# Patient Record
Sex: Female | Born: 1975 | Race: White | Hispanic: No | Marital: Married | Smoking: Current every day smoker
Health system: Southern US, Community
[De-identification: ages and names within clinical notes are randomized; demographics above are authoritative.]

## PROBLEM LIST (undated history)

## (undated) DIAGNOSIS — R591 Generalized enlarged lymph nodes: Secondary | ICD-10-CM

## (undated) DIAGNOSIS — Z1379 Encounter for other screening for genetic and chromosomal anomalies: Principal | ICD-10-CM

## (undated) DIAGNOSIS — K769 Liver disease, unspecified: Secondary | ICD-10-CM

## (undated) DIAGNOSIS — Z803 Family history of malignant neoplasm of breast: Secondary | ICD-10-CM

## (undated) DIAGNOSIS — E0510 Thyrotoxicosis with toxic single thyroid nodule without thyrotoxic crisis or storm: Secondary | ICD-10-CM

## (undated) HISTORY — DX: Liver disease, unspecified: K76.9

## (undated) HISTORY — DX: Hypocalcemia: E83.51

## (undated) HISTORY — DX: Family history of malignant neoplasm of breast: Z80.3

## (undated) HISTORY — DX: Encounter for other screening for genetic and chromosomal anomalies: Z13.79

## (undated) HISTORY — PX: THYROIDECTOMY: SHX17

## (undated) HISTORY — DX: Generalized enlarged lymph nodes: R59.1

---

## 1995-03-03 HISTORY — PX: APPENDECTOMY: SHX54

## 1999-03-03 HISTORY — PX: TUBAL LIGATION: SHX77

## 2003-03-03 HISTORY — PX: ABDOMINAL HYSTERECTOMY: SHX81

## 2010-04-03 ENCOUNTER — Emergency Department (HOSPITAL_BASED_OUTPATIENT_CLINIC_OR_DEPARTMENT_OTHER)
Admission: EM | Admit: 2010-04-03 | Discharge: 2010-04-03 | Disposition: A | Discharge status: Home / Self Care | Payer: BC Managed Care – PPO | Attending: Emergency Medicine | Admitting: Emergency Medicine

## 2010-04-03 DIAGNOSIS — R059 Cough, unspecified: Secondary | ICD-10-CM | POA: Insufficient documentation

## 2010-04-03 DIAGNOSIS — M940 Chondrocostal junction syndrome [Tietze]: Secondary | ICD-10-CM | POA: Insufficient documentation

## 2010-04-03 DIAGNOSIS — F17200 Nicotine dependence, unspecified, uncomplicated: Secondary | ICD-10-CM | POA: Insufficient documentation

## 2010-04-03 DIAGNOSIS — R0789 Other chest pain: Secondary | ICD-10-CM | POA: Insufficient documentation

## 2010-04-03 DIAGNOSIS — J40 Bronchitis, not specified as acute or chronic: Secondary | ICD-10-CM | POA: Insufficient documentation

## 2010-04-03 DIAGNOSIS — R05 Cough: Secondary | ICD-10-CM | POA: Insufficient documentation

## 2010-04-03 LAB — CBC
HCT: 39.6 % (ref 36.0–46.0)
Hemoglobin: 13.8 g/dL (ref 12.0–15.0)
MCH: 30.3 pg (ref 26.0–34.0)
MCHC: 34.8 g/dL (ref 30.0–36.0)
RBC: 4.55 MIL/uL (ref 3.87–5.11)

## 2010-04-03 LAB — BASIC METABOLIC PANEL
BUN: 10 mg/dL (ref 6–23)
Chloride: 107 mEq/L (ref 96–112)
Creatinine, Ser: 0.6 mg/dL (ref 0.4–1.2)
GFR calc Af Amer: >60 mL/min (ref >60)
GFR calc non Af Amer: >60 mL/min (ref >60)
Potassium: 3.5 mEq/L (ref 3.5–5.1)

## 2010-04-03 LAB — DIFFERENTIAL
Lymphocytes Relative: 31 % (ref 12–46)
Monocytes Absolute: 0.8 10*3/uL (ref 0.1–1.0)
Monocytes Relative: 10 % (ref 3–12)
Neutro Abs: 4.6 10*3/uL (ref 1.7–7.7)
Neutrophils Relative %: 58 % (ref 43–77)

## 2010-04-03 LAB — D-DIMER, QUANTITATIVE: D-Dimer, Quant: 0.22 ug/mL-FEU (ref 0.00–0.48)

## 2010-04-03 LAB — POCT CARDIAC MARKERS
CKMB, poc: 1.3 ng/mL (ref 1.0–8.0)
Troponin i, poc: <0.05 ng/mL (ref 0.00–0.09)

## 2010-04-07 ENCOUNTER — Encounter (HOSPITAL_BASED_OUTPATIENT_CLINIC_OR_DEPARTMENT_OTHER): Payer: Self-pay | Admitting: Radiology

## 2010-04-07 ENCOUNTER — Emergency Department (INDEPENDENT_AMBULATORY_CARE_PROVIDER_SITE_OTHER): Payer: BC Managed Care – PPO

## 2010-04-07 ENCOUNTER — Emergency Department (HOSPITAL_BASED_OUTPATIENT_CLINIC_OR_DEPARTMENT_OTHER)
Admission: EM | Admit: 2010-04-07 | Discharge: 2010-04-07 | Disposition: A | Discharge status: Home / Self Care | Payer: BC Managed Care – PPO | Attending: Emergency Medicine | Admitting: Emergency Medicine

## 2010-04-07 DIAGNOSIS — F17200 Nicotine dependence, unspecified, uncomplicated: Secondary | ICD-10-CM | POA: Insufficient documentation

## 2010-04-07 DIAGNOSIS — R079 Chest pain, unspecified: Secondary | ICD-10-CM

## 2010-04-07 DIAGNOSIS — R002 Palpitations: Secondary | ICD-10-CM

## 2010-04-07 DIAGNOSIS — R071 Chest pain on breathing: Secondary | ICD-10-CM | POA: Insufficient documentation

## 2010-04-07 LAB — BASIC METABOLIC PANEL
CO2: 24 mEq/L (ref 19–32)
Chloride: 108 mEq/L (ref 96–112)
GFR calc Af Amer: >60 mL/min (ref >60)
Potassium: 4.1 mEq/L (ref 3.5–5.1)
Sodium: 143 mEq/L (ref 135–145)

## 2010-04-07 LAB — POCT CARDIAC MARKERS
CKMB, poc: <1 ng/mL — ABNORMAL LOW (ref 1.0–8.0)
Myoglobin, poc: 27.2 ng/mL (ref 12–200)
Troponin i, poc: <0.05 ng/mL (ref 0.00–0.09)

## 2010-04-07 LAB — CBC
Hemoglobin: 14 g/dL (ref 12.0–15.0)
Platelets: 170 10*3/uL (ref 150–400)
RBC: 4.6 MIL/uL (ref 3.87–5.11)
WBC: 5.8 10*3/uL (ref 4.0–10.5)

## 2010-04-07 LAB — DIFFERENTIAL
Basophils Relative: 0 % (ref 0–1)
Eosinophils Absolute: 0 10*3/uL (ref 0.0–0.7)
Monocytes Relative: 8 % (ref 3–12)
Neutro Abs: 3 10*3/uL (ref 1.7–7.7)
Neutrophils Relative %: 52 % (ref 43–77)

## 2010-04-07 MED ORDER — IOHEXOL 350 MG/ML SOLN
100.0000 mL | Freq: Once | INTRAVENOUS | Status: AC | PRN
  Administered 2010-04-07: 80 mL via INTRAVENOUS

## 2010-04-15 ENCOUNTER — Ambulatory Visit (INDEPENDENT_AMBULATORY_CARE_PROVIDER_SITE_OTHER): Payer: BC Managed Care – PPO | Admitting: Internal Medicine

## 2010-04-15 ENCOUNTER — Other Ambulatory Visit: Payer: Self-pay | Admitting: Internal Medicine

## 2010-04-15 DIAGNOSIS — R922 Inconclusive mammogram: Secondary | ICD-10-CM

## 2010-04-15 DIAGNOSIS — N63 Unspecified lump in unspecified breast: Secondary | ICD-10-CM

## 2010-04-15 DIAGNOSIS — Z803 Family history of malignant neoplasm of breast: Secondary | ICD-10-CM

## 2010-04-15 DIAGNOSIS — E041 Nontoxic single thyroid nodule: Secondary | ICD-10-CM

## 2010-04-21 ENCOUNTER — Ambulatory Visit
Admission: RE | Admit: 2010-04-21 | Discharge: 2010-04-21 | Disposition: A | Discharge status: Home / Self Care | Payer: BC Managed Care – PPO | Source: Ambulatory Visit | Attending: Internal Medicine | Admitting: Internal Medicine

## 2010-04-21 DIAGNOSIS — R922 Inconclusive mammogram: Secondary | ICD-10-CM

## 2010-04-21 DIAGNOSIS — Z803 Family history of malignant neoplasm of breast: Secondary | ICD-10-CM

## 2010-04-22 ENCOUNTER — Other Ambulatory Visit (HOSPITAL_COMMUNITY): Payer: BC Managed Care – PPO

## 2010-04-22 ENCOUNTER — Ambulatory Visit (HOSPITAL_COMMUNITY): Payer: BC Managed Care – PPO

## 2010-04-23 ENCOUNTER — Ambulatory Visit: Payer: BC Managed Care – PPO | Admitting: Internal Medicine

## 2010-04-23 ENCOUNTER — Other Ambulatory Visit (HOSPITAL_COMMUNITY): Payer: BC Managed Care – PPO

## 2010-04-24 ENCOUNTER — Other Ambulatory Visit: Payer: Self-pay | Admitting: Internal Medicine

## 2010-04-24 ENCOUNTER — Ambulatory Visit: Payer: BC Managed Care – PPO | Admitting: Internal Medicine

## 2010-04-24 ENCOUNTER — Ambulatory Visit (INDEPENDENT_AMBULATORY_CARE_PROVIDER_SITE_OTHER): Payer: BC Managed Care – PPO | Admitting: Internal Medicine

## 2010-04-24 ENCOUNTER — Ambulatory Visit (HOSPITAL_BASED_OUTPATIENT_CLINIC_OR_DEPARTMENT_OTHER)
Admission: RE | Admit: 2010-04-24 | Discharge: 2010-04-24 | Disposition: A | Discharge status: Home / Self Care | Payer: BC Managed Care – PPO | Source: Ambulatory Visit | Attending: Internal Medicine | Admitting: Internal Medicine

## 2010-04-24 DIAGNOSIS — E049 Nontoxic goiter, unspecified: Secondary | ICD-10-CM | POA: Insufficient documentation

## 2010-04-24 DIAGNOSIS — E042 Nontoxic multinodular goiter: Secondary | ICD-10-CM

## 2010-04-24 DIAGNOSIS — D493 Neoplasm of unspecified behavior of breast: Secondary | ICD-10-CM

## 2010-04-24 DIAGNOSIS — E0590 Thyrotoxicosis, unspecified without thyrotoxic crisis or storm: Secondary | ICD-10-CM

## 2010-04-24 DIAGNOSIS — F17200 Nicotine dependence, unspecified, uncomplicated: Secondary | ICD-10-CM

## 2010-05-01 ENCOUNTER — Other Ambulatory Visit: Payer: Self-pay | Admitting: Internal Medicine

## 2010-05-01 DIAGNOSIS — E041 Nontoxic single thyroid nodule: Secondary | ICD-10-CM

## 2010-05-07 ENCOUNTER — Ambulatory Visit
Admission: RE | Admit: 2010-05-07 | Discharge: 2010-05-07 | Disposition: A | Discharge status: Home / Self Care | Payer: BC Managed Care – PPO | Source: Ambulatory Visit | Attending: Internal Medicine | Admitting: Internal Medicine

## 2010-05-07 ENCOUNTER — Other Ambulatory Visit (HOSPITAL_COMMUNITY)
Admission: RE | Admit: 2010-05-07 | Discharge: 2010-05-07 | Disposition: A | Discharge status: Home / Self Care | Payer: BC Managed Care – PPO | Source: Ambulatory Visit | Attending: Interventional Radiology | Admitting: Interventional Radiology

## 2010-05-07 ENCOUNTER — Other Ambulatory Visit: Payer: Self-pay | Admitting: Interventional Radiology

## 2010-05-07 DIAGNOSIS — E049 Nontoxic goiter, unspecified: Secondary | ICD-10-CM | POA: Insufficient documentation

## 2010-05-07 DIAGNOSIS — E041 Nontoxic single thyroid nodule: Secondary | ICD-10-CM

## 2010-05-15 ENCOUNTER — Other Ambulatory Visit (HOSPITAL_COMMUNITY): Payer: Self-pay | Admitting: Internal Medicine

## 2010-05-15 DIAGNOSIS — E0590 Thyrotoxicosis, unspecified without thyrotoxic crisis or storm: Secondary | ICD-10-CM

## 2010-05-19 ENCOUNTER — Encounter (HOSPITAL_COMMUNITY)
Admission: RE | Admit: 2010-05-19 | Discharge: 2010-05-19 | Disposition: A | Discharge status: Home / Self Care | Payer: BC Managed Care – PPO | Source: Ambulatory Visit | Attending: Endocrinology | Admitting: Endocrinology

## 2010-05-19 DIAGNOSIS — E0590 Thyrotoxicosis, unspecified without thyrotoxic crisis or storm: Secondary | ICD-10-CM | POA: Insufficient documentation

## 2010-05-20 ENCOUNTER — Encounter (HOSPITAL_COMMUNITY): Payer: Self-pay

## 2010-05-20 ENCOUNTER — Encounter (HOSPITAL_COMMUNITY)
Admission: RE | Admit: 2010-05-20 | Discharge: 2010-05-20 | Disposition: A | Discharge status: Home / Self Care | Payer: BC Managed Care – PPO | Source: Ambulatory Visit | Attending: Internal Medicine | Admitting: Internal Medicine

## 2010-05-20 DIAGNOSIS — E0500 Thyrotoxicosis with diffuse goiter without thyrotoxic crisis or storm: Secondary | ICD-10-CM | POA: Insufficient documentation

## 2010-05-20 HISTORY — DX: Thyrotoxicosis with toxic single thyroid nodule without thyrotoxic crisis or storm: E05.10

## 2010-05-20 MED ORDER — SODIUM PERTECHNETATE TC 99M INJECTION
10.3000 | Freq: Once | INTRAVENOUS | Status: AC | PRN
  Administered 2010-05-20: 10.3 via INTRAVENOUS

## 2010-05-20 MED ORDER — SODIUM IODIDE I 131 CAPSULE
11.4000 | Freq: Once | INTRAVENOUS | Status: AC | PRN
  Administered 2010-05-19: 11.4 via ORAL

## 2010-05-21 ENCOUNTER — Ambulatory Visit (INDEPENDENT_AMBULATORY_CARE_PROVIDER_SITE_OTHER): Payer: BC Managed Care – PPO | Admitting: Internal Medicine

## 2010-05-21 ENCOUNTER — Ambulatory Visit: Payer: BC Managed Care – PPO | Admitting: Internal Medicine

## 2010-05-21 DIAGNOSIS — E049 Nontoxic goiter, unspecified: Secondary | ICD-10-CM

## 2010-05-21 DIAGNOSIS — E0590 Thyrotoxicosis, unspecified without thyrotoxic crisis or storm: Secondary | ICD-10-CM

## 2010-07-03 ENCOUNTER — Other Ambulatory Visit: Payer: Self-pay | Admitting: General Surgery

## 2010-07-03 ENCOUNTER — Encounter (HOSPITAL_COMMUNITY): Payer: BC Managed Care – PPO

## 2010-07-03 ENCOUNTER — Other Ambulatory Visit (HOSPITAL_COMMUNITY): Payer: Self-pay | Admitting: General Surgery

## 2010-07-03 ENCOUNTER — Ambulatory Visit (HOSPITAL_COMMUNITY)
Admission: RE | Admit: 2010-07-03 | Discharge: 2010-07-03 | Disposition: A | Discharge status: Home / Self Care | Payer: BC Managed Care – PPO | Source: Ambulatory Visit | Attending: General Surgery | Admitting: General Surgery

## 2010-07-03 DIAGNOSIS — I10 Essential (primary) hypertension: Secondary | ICD-10-CM | POA: Insufficient documentation

## 2010-07-03 DIAGNOSIS — E0520 Thyrotoxicosis with toxic multinodular goiter without thyrotoxic crisis or storm: Secondary | ICD-10-CM | POA: Insufficient documentation

## 2010-07-03 DIAGNOSIS — I517 Cardiomegaly: Secondary | ICD-10-CM | POA: Insufficient documentation

## 2010-07-03 DIAGNOSIS — Z01818 Encounter for other preprocedural examination: Secondary | ICD-10-CM

## 2010-07-03 DIAGNOSIS — Z01812 Encounter for preprocedural laboratory examination: Secondary | ICD-10-CM | POA: Insufficient documentation

## 2010-07-03 LAB — SURGICAL PCR SCREEN
MRSA, PCR: NEGATIVE
Staphylococcus aureus: NEGATIVE

## 2010-07-03 LAB — BASIC METABOLIC PANEL
BUN: 12 mg/dL (ref 6–23)
GFR calc Af Amer: >60 mL/min (ref >60)
GFR calc non Af Amer: >60 mL/min (ref >60)
Potassium: 4.3 mEq/L (ref 3.5–5.1)

## 2010-07-03 LAB — CBC
MCV: 89.6 fL (ref 78.0–100.0)
Platelets: 185 10*3/uL (ref 150–400)
RBC: 4.44 MIL/uL (ref 3.87–5.11)
RDW: 14.5 % (ref 11.5–15.5)
WBC: 9.1 10*3/uL (ref 4.0–10.5)

## 2010-07-07 ENCOUNTER — Other Ambulatory Visit: Payer: Self-pay | Admitting: General Surgery

## 2010-07-07 ENCOUNTER — Observation Stay (HOSPITAL_COMMUNITY)
Admission: RE | Admit: 2010-07-07 | Discharge: 2010-07-08 | Disposition: A | Discharge status: Home / Self Care | Payer: BC Managed Care – PPO | Source: Ambulatory Visit | Attending: General Surgery | Admitting: General Surgery

## 2010-07-07 DIAGNOSIS — I10 Essential (primary) hypertension: Secondary | ICD-10-CM | POA: Insufficient documentation

## 2010-07-07 DIAGNOSIS — F17200 Nicotine dependence, unspecified, uncomplicated: Secondary | ICD-10-CM | POA: Insufficient documentation

## 2010-07-07 DIAGNOSIS — E213 Hyperparathyroidism, unspecified: Secondary | ICD-10-CM | POA: Insufficient documentation

## 2010-07-07 DIAGNOSIS — D34 Benign neoplasm of thyroid gland: Principal | ICD-10-CM | POA: Insufficient documentation

## 2010-07-07 DIAGNOSIS — G8190 Hemiplegia, unspecified affecting unspecified side: Secondary | ICD-10-CM | POA: Insufficient documentation

## 2010-07-07 LAB — CALCIUM: Calcium: 8.9 mg/dL (ref 8.4–10.5)

## 2010-07-30 ENCOUNTER — Other Ambulatory Visit: Payer: Self-pay | Admitting: Internal Medicine

## 2010-07-30 ENCOUNTER — Ambulatory Visit (INDEPENDENT_AMBULATORY_CARE_PROVIDER_SITE_OTHER): Payer: BC Managed Care – PPO | Admitting: Internal Medicine

## 2010-07-30 DIAGNOSIS — R591 Generalized enlarged lymph nodes: Secondary | ICD-10-CM

## 2010-07-30 DIAGNOSIS — R599 Enlarged lymph nodes, unspecified: Secondary | ICD-10-CM

## 2010-07-30 DIAGNOSIS — E039 Hypothyroidism, unspecified: Secondary | ICD-10-CM

## 2010-08-13 ENCOUNTER — Ambulatory Visit (HOSPITAL_BASED_OUTPATIENT_CLINIC_OR_DEPARTMENT_OTHER)
Admission: RE | Admit: 2010-08-13 | Discharge: 2010-08-13 | Disposition: A | Discharge status: Home / Self Care | Payer: BC Managed Care – PPO | Source: Ambulatory Visit | Attending: Internal Medicine | Admitting: Internal Medicine

## 2010-08-13 DIAGNOSIS — R591 Generalized enlarged lymph nodes: Secondary | ICD-10-CM

## 2010-08-13 DIAGNOSIS — K7689 Other specified diseases of liver: Secondary | ICD-10-CM | POA: Insufficient documentation

## 2010-08-13 DIAGNOSIS — R9389 Abnormal findings on diagnostic imaging of other specified body structures: Secondary | ICD-10-CM

## 2010-08-13 DIAGNOSIS — J984 Other disorders of lung: Secondary | ICD-10-CM | POA: Insufficient documentation

## 2010-08-13 MED ORDER — IOHEXOL 300 MG/ML  SOLN
80.0000 mL | Freq: Once | INTRAMUSCULAR | Status: AC | PRN
  Administered 2010-08-13: 80 mL via INTRAVENOUS

## 2010-08-14 ENCOUNTER — Other Ambulatory Visit: Payer: Self-pay | Admitting: Internal Medicine

## 2010-08-14 ENCOUNTER — Ambulatory Visit (HOSPITAL_BASED_OUTPATIENT_CLINIC_OR_DEPARTMENT_OTHER)
Admission: RE | Admit: 2010-08-14 | Discharge: 2010-08-14 | Disposition: A | Discharge status: Home / Self Care | Payer: BC Managed Care – PPO | Source: Ambulatory Visit | Attending: Internal Medicine | Admitting: Internal Medicine

## 2010-08-14 ENCOUNTER — Ambulatory Visit (INDEPENDENT_AMBULATORY_CARE_PROVIDER_SITE_OTHER): Payer: BC Managed Care – PPO | Admitting: Internal Medicine

## 2010-08-14 DIAGNOSIS — Z01818 Encounter for other preprocedural examination: Secondary | ICD-10-CM | POA: Insufficient documentation

## 2010-08-14 DIAGNOSIS — E039 Hypothyroidism, unspecified: Secondary | ICD-10-CM

## 2010-08-14 DIAGNOSIS — K769 Liver disease, unspecified: Secondary | ICD-10-CM

## 2010-08-14 DIAGNOSIS — Z0389 Encounter for observation for other suspected diseases and conditions ruled out: Secondary | ICD-10-CM

## 2010-08-14 DIAGNOSIS — Z9889 Other specified postprocedural states: Secondary | ICD-10-CM

## 2010-08-16 ENCOUNTER — Ambulatory Visit (HOSPITAL_BASED_OUTPATIENT_CLINIC_OR_DEPARTMENT_OTHER)
Admission: RE | Admit: 2010-08-16 | Discharge: 2010-08-16 | Disposition: A | Discharge status: Home / Self Care | Payer: BC Managed Care – PPO | Source: Ambulatory Visit | Attending: Internal Medicine | Admitting: Internal Medicine

## 2010-08-16 ENCOUNTER — Ambulatory Visit (HOSPITAL_BASED_OUTPATIENT_CLINIC_OR_DEPARTMENT_OTHER): Payer: BC Managed Care – PPO

## 2010-08-16 DIAGNOSIS — Q619 Cystic kidney disease, unspecified: Secondary | ICD-10-CM | POA: Insufficient documentation

## 2010-08-16 DIAGNOSIS — K7689 Other specified diseases of liver: Secondary | ICD-10-CM | POA: Insufficient documentation

## 2010-08-16 DIAGNOSIS — K769 Liver disease, unspecified: Secondary | ICD-10-CM

## 2010-08-16 MED ORDER — GADOBENATE DIMEGLUMINE 529 MG/ML IV SOLN
14.0000 mL | Freq: Once | INTRAVENOUS | Status: AC | PRN
  Administered 2010-08-16: 14 mL via INTRAVENOUS

## 2010-08-18 NOTE — Op Note (Signed)
Mcpherson Mcpherson Mcpherson, Mcpherson Mcpherson Mcpherson                ACCOUNT NO.:  [id number]  MEDICAL RECORD NO.:  [id number]           PATIENT TYPE:  O  LOCATION:  DAYL                         FACILITY:  [facility]  PHYSICIAN:  Mcpherson Muckle, MD      DATE OF BIRTH:  08/10/1975  DATE OF PROCEDURE: DATE OF DISCHARGE:                              OPERATIVE REPORT   PREOPERATIVE DIAGNOSIS:  Multinodular goiter with hyperthyroidism.  POSTOPERATIVE DIAGNOSIS:  Multinodular goiter with hyperparathyroidism.  PROCEDURE:  Total thyroidectomy.  SURGEON:  Mcpherson Mcpherson Mcpherson. Mcpherson Mcpherson Mcpherson, M.D.  ASSISTANT:  Mcpherson Mcpherson Mcpherson. Mcpherson Mcpherson Mcpherson, M.D.  ANESTHESIA:  General endotracheal anesthesia.  ESTIMATED BLOOD LOSS:  Minimal.  FINDINGS:  Large cystic lesion in the right inferior lobe.  No immediate complications.  No drains were placed.  INDICATIONS FOR PROCEDURE:  Mcpherson Mcpherson Mcpherson is Mcpherson [name] who has been seen by Dr. Talmage Nap after finding Mcpherson Mcpherson multinodular goiter on CT scan.  She was found to have hyperparathyroidism and Mcpherson Mcpherson suppressed TSH of 0.008.  She has had some complaints of palpitations.  She was started preoperatively on methimazole and metoprolol.  She desired to have thyroidectomy versus radiation therapy.  She was seen preoperatively. Risks of surgery explained.  Informed consent was obtained.  DETAILS OF PROCEDURE:  Mcpherson Mcpherson Mcpherson was identified in preoperative holding, seen by Anesthesia.  She was taken to the operating room, placed in supine position.  She had Mcpherson [name], however, and had some mild complaints of discomfort, but no significant abnormalities were noted during the surgery.  It was discussed by Anesthesia the patient to follow up as an outpatient with primary care physician.  She was then placed under general endotracheal anesthesia without difficulty.  Sequential compression devices were applied to her lower extremity.  Her anterior neck was prepped and draped in usual sterile fashion.   Surgical time-out performed.  I began by drawing an incision 2 fingerbreadths above the sternal notch.  I measured Mcpherson [name], divided the platysma muscle with electrocautery and elevated the inferior and superior flaps.  I had to transect the anterior jugular vein using 3-0 ties.  I then divided the strap muscles at the midline using electrocautery.  I began starting on the right side.  Elevating the strap muscles off the thyroid gland.  I used blunt dissection and electrocautery.  It was slightly adhered to the thyroid on the right side.  I was able to isolate superior pole vessels and place 2 clips proximally and one distally, and used the Harmonic scalpel to dissect this pole down.  I continued coursing inferiorly towards the middle thyroidal vessels.  This was slightly more adhered in this region.  I found it easier to dissect on the inferior pole vessels.  The cystic lesion allowed me to pull this up from the surrounding tissues without much difficulty.  I isolated the inferior pole vessels, clipped and divided these.  I continued dissecting towards the middle thyroidal artery.  It was somewhat densely adhered to the strap muscle.  I utilized the Harmonic scalpel to dissect this in this vicinity.  I left  Mcpherson Mcpherson small piece of the thyroidal tissue dissected around the middle thyroidal vessel.  I then placed the clips and used the harmonic scalpel to dissect this around the vicinity of the recurrent laryngeal nerve. We did visualize the superior parathyroid gland.  I continued dissecting off the trachea using electrocautery.  I then began dissecting on the left side.  We swept the strap muscles off the trachea, isolated superior pole vessels without difficulty.  This was much easier on the left side.  Once I clipped and divided the superior pole vessels, I isolated the inferior pole vessels.  I continued dissecting on the middle thyroidal artery.  We saw the superior and inferior  parathyroid glands, swept these back into their anatomic position.  We were able to visualize recurrent laryngeal nerve on the left.  Continued dividing the thyroid off the midline and trachea.  I then removed the specimen intact.  I placed Mcpherson Mcpherson stitch on the left superior pole.  I then irrigated the wound bed, found no bleeding on the left, placed fibrillar within the wound bed.  We irrigated on the right.  There was Mcpherson Mcpherson minimal amount of oozing from the remnant of the thyroidal tissue.  [name], which easily corrected the slight oozing.  I then reapproximated the strap muscles at the midline using 3-0 Vicryl in Mcpherson Mcpherson running fashion.  [name].  I then reapproximated the strap muscle in interrupted fashion.  I also injected this with Mcpherson Mcpherson local block.  Mcpherson Mcpherson 30 mL of 0.25% Marcaine was used for local anesthesia at all sites.  After injecting the epidermis, I closed the skin with Mcpherson Mcpherson 4-0 Monocryl in Mcpherson Mcpherson running fashion.  Steri-Strip was placed as final dressing.  The patient was extubated and transferred to postanesthesia care unit in stable condition.  She had some slight hoarseness in her voice in the Recovery, we will evaluate in the morning.     Mcpherson Muckle, MD     ALA/MEDQ  D:  07/07/2010  T:  07/07/2010  Job:  [id number]  Electronically Signed by Bertram Savin MD on 08/18/2010 12:04:32 PM

## 2010-08-28 ENCOUNTER — Encounter: Payer: Self-pay | Admitting: Gastroenterology

## 2010-10-06 ENCOUNTER — Encounter: Payer: Self-pay | Admitting: Emergency Medicine

## 2010-10-07 ENCOUNTER — Encounter: Payer: Self-pay | Admitting: Gastroenterology

## 2010-10-07 ENCOUNTER — Ambulatory Visit (INDEPENDENT_AMBULATORY_CARE_PROVIDER_SITE_OTHER): Payer: BC Managed Care – PPO | Admitting: Gastroenterology

## 2010-10-07 ENCOUNTER — Ambulatory Visit: Payer: BC Managed Care – PPO

## 2010-10-07 VITALS — BP 116/74 | HR 76 | Ht 61.0 in | Wt 164.0 lb

## 2010-10-07 DIAGNOSIS — R933 Abnormal findings on diagnostic imaging of other parts of digestive tract: Secondary | ICD-10-CM

## 2010-10-07 LAB — COMPREHENSIVE METABOLIC PANEL
ALT: 23 U/L (ref 0–35)
AST: 22 U/L (ref 0–37)
Alkaline Phosphatase: 57 U/L (ref 39–117)
CO2: 24 mEq/L (ref 19–32)
Creatinine, Ser: 0.5 mg/dL (ref 0.4–1.2)
GFR: 149.42 mL/min (ref >60.00)
Sodium: 138 mEq/L (ref 135–145)
Total Bilirubin: 0.8 mg/dL (ref 0.3–1.2)
Total Protein: 7.7 g/dL (ref 6.0–8.3)

## 2010-10-07 LAB — CBC WITH DIFFERENTIAL/PLATELET
Basophils Absolute: 0 10*3/uL (ref 0.0–0.1)
Eosinophils Absolute: 0.1 10*3/uL (ref 0.0–0.7)
HCT: 41.2 % (ref 36.0–46.0)
Hemoglobin: 14 g/dL (ref 12.0–15.0)
Lymphs Abs: 2.4 10*3/uL (ref 0.7–4.0)
MCHC: 34.1 g/dL (ref 30.0–36.0)
MCV: 94.8 fl (ref 78.0–100.0)
Monocytes Absolute: 0.4 10*3/uL (ref 0.1–1.0)
Neutro Abs: 3.7 10*3/uL (ref 1.4–7.7)
Platelets: 166 10*3/uL (ref 150.0–400.0)
RDW: 13.8 % (ref 11.5–14.6)

## 2010-10-07 LAB — IBC PANEL
Iron: 85 ug/dL (ref 42–145)
Saturation Ratios: 19.9 % — ABNORMAL LOW (ref 20.0–50.0)
Transferrin: 305 mg/dL (ref 212.0–360.0)

## 2010-10-07 LAB — FERRITIN: Ferritin: 15.1 ng/mL (ref 10.0–291.0)

## 2010-10-07 LAB — PROTIME-INR
INR: 1 ratio (ref 0.8–1.0)
Prothrombin Time: 11.7 s (ref 10.2–12.4)

## 2010-10-07 NOTE — Progress Notes (Signed)
HPI: This is a  very pleasant 35 year old woman who had chest CT around the side of some chest pains, several months ago. This picked up a small liver lesion. That was followed up by repeat imaging including an MRI June 2012. This was described as a 1-1.5 cm mildly enhancing lesion in the right lobe of the liver. The radiologist felt by imaging this was probably either focal nodular hyperplasia or hepatic adenoma.  Liver tests done in May 2012 were normal.  She has never had liver disease that she is aware of, she has never had jaundice or hepatitis. No liver disease runs in her family.  Overall gaining weight.  Minor right sided fullness.  NO BCPs.    Review of systems: Pertinent positive and negative review of systems were noted in the above HPI section.  All other review of systems was otherwise negative.   Past Medical History  Diagnosis Date  . Thyroid nodule, toxic or with hyperthyroidism   . Lymphadenopathy   . Hypocalcemia     post-op total thyroidectomy  . Liver lesion, right lobe     on MRI 08/16/10    Past Surgical History  Procedure Date  . Thyroidectomy   . Appendectomy 1997  . Abdominal hysterectomy 2005    partial  . Tubal ligation 2001     reports that she quit smoking about 5 weeks ago. Her smoking use included Cigarettes. She has a 7.5 pack-year smoking history. She has never used smokeless tobacco. She reports that she drinks about .5 ounces of alcohol per week. She reports that she does not use illicit drugs.  family history includes Breast cancer in her mother; Colon cancer in her maternal grandmother; and Heart disease in her father.    Current Medications, Allergies were all reviewed with the patient via [facility] electronic medical record system.    Physical Exam: BP 116/74  Pulse 76  Ht 5\' 1"  (1.549 m)  Wt 164 lb (74.39 kg)  BMI 30.99 kg/m2 Constitutional: generally well-appearing Psychiatric: alert and oriented x3 Eyes: extraocular  movements intact Mouth: oral pharynx moist, no lesions Neck: supple no lymphadenopathy Cardiovascular: heart regular rate and rhythm Lungs: clear to auscultation bilaterally Abdomen: soft, nontender, nondistended, no obvious ascites, no peritoneal signs, normal bowel sounds Extremities: no lower extremity edema bilaterally Skin: no lesions on visible extremities    Assessment and plan: 35 y.o. female with incidentally noted 1-1.5 cm solitary mildly enhancing liver lesion.  I suspect this is either focal nodular hyperplasia or a small hepatic adenoma. She is not on birth control pills and since she had a hysterectomy many years ago she will never be on birth control pills and will not be getting pregnant either and so the risk of estrogen exposure in the future is negligible. I think simply repeating MRI of her liver in 6 month interval is most reasonable. She will also get basic set of liver tests, some viral studies to confirm that she does not have chronic liver disease.

## 2010-10-07 NOTE — Patient Instructions (Addendum)
Hepatic adenoma or focal nodular hyperplasia. Repeat liver MRI in December 2012 for follow up. A copy of this information will be made available to Dr. Constance Goltz. You will have labs checked today in the basement lab.  Please head down after you check out with the front desk  ( Hepatitis B surface antigen, Hepatitis B surface antibody, Hepatitis C antibody, total iron, ferritin, TIBC, alphafeto protein (AFP),  Cerulloplasm, CBC, CMET, INR.

## 2010-10-08 LAB — IRON AND TIBC
%SAT: 22 % (ref 20–55)
Iron: 85 ug/dL (ref 42–145)
UIBC: 297 ug/dL

## 2010-10-08 LAB — HEPATITIS B SURFACE ANTIBODY,QUALITATIVE: Hep B S Ab: NEGATIVE

## 2010-10-08 LAB — CERULOPLASMIN: Ceruloplasmin: 25 mg/dL (ref 20–60)

## 2010-11-27 ENCOUNTER — Ambulatory Visit (INDEPENDENT_AMBULATORY_CARE_PROVIDER_SITE_OTHER): Payer: BC Managed Care – PPO | Admitting: Internal Medicine

## 2010-11-27 ENCOUNTER — Ambulatory Visit (HOSPITAL_BASED_OUTPATIENT_CLINIC_OR_DEPARTMENT_OTHER)
Admission: RE | Admit: 2010-11-27 | Discharge: 2010-11-27 | Disposition: A | Discharge status: Home / Self Care | Payer: BC Managed Care – PPO | Source: Ambulatory Visit | Attending: Internal Medicine | Admitting: Internal Medicine

## 2010-11-27 ENCOUNTER — Encounter: Payer: Self-pay | Admitting: Internal Medicine

## 2010-11-27 DIAGNOSIS — R591 Generalized enlarged lymph nodes: Secondary | ICD-10-CM | POA: Insufficient documentation

## 2010-11-27 DIAGNOSIS — K7689 Other specified diseases of liver: Secondary | ICD-10-CM

## 2010-11-27 DIAGNOSIS — R109 Unspecified abdominal pain: Secondary | ICD-10-CM

## 2010-11-27 DIAGNOSIS — K769 Liver disease, unspecified: Secondary | ICD-10-CM | POA: Insufficient documentation

## 2010-11-27 DIAGNOSIS — R319 Hematuria, unspecified: Secondary | ICD-10-CM

## 2010-11-27 DIAGNOSIS — M549 Dorsalgia, unspecified: Secondary | ICD-10-CM | POA: Insufficient documentation

## 2010-11-27 DIAGNOSIS — R599 Enlarged lymph nodes, unspecified: Secondary | ICD-10-CM

## 2010-11-27 DIAGNOSIS — E0510 Thyrotoxicosis with toxic single thyroid nodule without thyrotoxic crisis or storm: Secondary | ICD-10-CM | POA: Insufficient documentation

## 2010-11-27 LAB — POCT URINALYSIS DIPSTICK
Blood, UA: NEGATIVE
Nitrite, UA: NEGATIVE
pH, UA: 6

## 2010-11-27 LAB — COMPREHENSIVE METABOLIC PANEL
BUN: 14 mg/dL (ref 6–23)
CO2: 22 mEq/L (ref 19–32)
Calcium: 9.6 mg/dL (ref 8.4–10.5)
Chloride: 107 mEq/L (ref 96–112)
Creat: 0.78 mg/dL (ref 0.50–1.10)
Total Bilirubin: 0.2 mg/dL — ABNORMAL LOW (ref 0.3–1.2)

## 2010-11-27 NOTE — Progress Notes (Signed)
  Subjective:    Patient ID: Kerry Mcpherson, female    DOB: [birth year], 35 y.o.   MRN: [id number]  HPI  Kerry Mcpherson presents with one month of R sided flank pain that radiates to groin.  She saw blood clots in her urine 3 days ago.  No dysuria, no urgency, no fever nausea or chills.  FH pos for renal stones in mother.  Pain worsening over the last few days.  She report she quit taking her tums but does not have muscle cramps or spasms  No Known Allergies Past Medical History  Diagnosis Date  . Lymphadenopathy     subcarinal  . Hypocalcemia     post-op total thyroidectomy  . Liver lesion, right lobe     on MRI 08/16/10 adenoma vs hyperplasia  . Thyroid nodule, toxic or with hyperthyroidism    Past Surgical History  Procedure Date  . Thyroidectomy   . Appendectomy 1997  . Abdominal hysterectomy 2005    partial  . Tubal ligation 2001   History   Social History  . Marital Status: Married    Spouse Name: N/A    Number of Children: 3  . Years of Education: N/A   Occupational History  . Student    Social History Main Topics  . Smoking status: Former Smoker -- 0.5 packs/day for 15 years    Types: Cigarettes    Quit date: 08/31/2010  . Smokeless tobacco: Never Used  . Alcohol Use: 0.5 oz/week    1 drink(s) per week  . Drug Use: No  . Sexually Active: Yes    Birth Control/ Protection: Surgical   Other Topics Concern  . Not on file   Social History Narrative  . No narrative on file   Family History  Problem Relation Age of Onset  . Breast cancer Mother   . Heart disease Father   . Colon cancer Maternal Grandmother    Patient Active Problem List  Diagnoses  . Thyroid nodule, toxic or with hyperthyroidism  . Lymphadenopathy  . Hypocalcemia  . Liver lesion, right lobe   Current Outpatient Prescriptions on File Prior to Visit  Medication Sig Dispense Refill  . levothyroxine (SYNTHROID, LEVOTHROID) 88 MCG tablet Take 88 mcg by mouth daily.        . calcium carbonate  (TUMS - DOSED IN MG ELEMENTAL CALCIUM) 500 MG chewable tablet Chew 3 tablets by mouth 3 (three) times daily.              Review of Systems    See HPI Objective:   Physical Exam  Physical Exam  Nursing note and vitals reviewed.  Constitutional: She is oriented to person, place, and time. She appears well-developed and well-nourished.  HENT:  Head: Normocephalic and atraumatic.  Cardiovascular: Normal rate and regular rhythm. Exam reveals no gallop and no friction rub.  No murmur heard.  Pulmonary/Chest: Breath sounds normal. She has no wheezes. She has no rales. ABD.   BS pos.  Tender to palpation in R flank area.. No suprapubic tenderness.   No peritoneal signs  Neurological: She is alert and oriented to person, place, and time.  Skin: Skin is warm and dry.  Psychiatric: She has a normal mood and affect. Her behavior is normal.      Assessment & Plan:  1)  R flank pain hematuria  Will get helical CT today.  Further tx based on results 2)  Post surgical hypocalcemia  Check Ca today.  Pt asymptomatic off Tums

## 2010-11-27 NOTE — Progress Notes (Signed)
  Subjective:    Patient ID: Kerry Mcpherson, female    DOB: March 27, 1975, 35 y.o.   MRN: [id number]  HPI    Review of Systems     Objective:   Physical Exam        Assessment & Plan:  Addendum  :  CT neg for stone no hydronephrosis.  Back pain may be M/S related.  Will give T#3 # 12 1 po q6h then I buprofen 800 mg i tid prn.  See me in 3 weeks.  Pt voices understanding

## 2010-11-27 NOTE — Patient Instructions (Signed)
To CT today.    Labs will be mailed to you

## 2010-12-01 ENCOUNTER — Encounter: Payer: Self-pay | Admitting: Emergency Medicine

## 2011-02-05 ENCOUNTER — Emergency Department (INDEPENDENT_AMBULATORY_CARE_PROVIDER_SITE_OTHER): Payer: BC Managed Care – PPO

## 2011-02-05 ENCOUNTER — Encounter (HOSPITAL_BASED_OUTPATIENT_CLINIC_OR_DEPARTMENT_OTHER): Payer: Self-pay | Admitting: *Deleted

## 2011-02-05 ENCOUNTER — Ambulatory Visit (INDEPENDENT_AMBULATORY_CARE_PROVIDER_SITE_OTHER): Payer: BC Managed Care – PPO | Admitting: Internal Medicine

## 2011-02-05 ENCOUNTER — Emergency Department (HOSPITAL_BASED_OUTPATIENT_CLINIC_OR_DEPARTMENT_OTHER)
Admission: EM | Admit: 2011-02-05 | Discharge: 2011-02-05 | Disposition: A | Discharge status: Home / Self Care | Payer: BC Managed Care – PPO | Attending: Emergency Medicine | Admitting: Emergency Medicine

## 2011-02-05 ENCOUNTER — Encounter: Payer: Self-pay | Admitting: Internal Medicine

## 2011-02-05 VITALS — BP 133/87 | HR 96 | Temp 97.6°F | Resp 12 | Ht 61.0 in | Wt 162.0 lb

## 2011-02-05 DIAGNOSIS — R3915 Urgency of urination: Secondary | ICD-10-CM | POA: Insufficient documentation

## 2011-02-05 DIAGNOSIS — R350 Frequency of micturition: Secondary | ICD-10-CM

## 2011-02-05 DIAGNOSIS — R1031 Right lower quadrant pain: Secondary | ICD-10-CM

## 2011-02-05 LAB — COMPREHENSIVE METABOLIC PANEL
Albumin: 4.5 g/dL (ref 3.5–5.2)
Alkaline Phosphatase: 65 U/L (ref 39–117)
BUN: 11 mg/dL (ref 6–23)
Calcium: 9.5 mg/dL (ref 8.4–10.5)
Creatinine, Ser: 0.7 mg/dL (ref 0.50–1.10)
Potassium: 4.1 mEq/L (ref 3.5–5.1)
Total Protein: 8 g/dL (ref 6.0–8.3)

## 2011-02-05 LAB — CBC
HCT: 41.2 % (ref 36.0–46.0)
MCH: 31.6 pg (ref 26.0–34.0)
MCHC: 34.2 g/dL (ref 30.0–36.0)
RDW: 14.1 % (ref 11.5–15.5)

## 2011-02-05 LAB — POCT URINALYSIS DIPSTICK
Bilirubin, UA: NEGATIVE
Glucose, UA: NEGATIVE
Leukocytes, UA: NEGATIVE
Nitrite, UA: NEGATIVE

## 2011-02-05 LAB — LIPASE, BLOOD: Lipase: 29 U/L (ref 11–59)

## 2011-02-05 MED ORDER — HYDROCODONE-ACETAMINOPHEN 5-500 MG PO TABS: 1.0000 | ORAL_TABLET | Freq: Four times a day (QID) | ORAL | Status: AC | PRN

## 2011-02-05 NOTE — ED Provider Notes (Signed)
Medical screening examination/treatment/procedure(s) were performed by non-physician practitioner and as supervising physician I was immediately available for consultation/collaboration.  Cyndra Numbers, MD 02/05/11 [phone]

## 2011-02-05 NOTE — ED Notes (Signed)
Pt acuity changed from 4 to 3 based on plan of care. 

## 2011-02-05 NOTE — ED Notes (Addendum)
Right lower quad pain x 2.5 months. Pain is getting worse. Saw her MD this am and was sent here to have further evaluation. PCP called to state pt had recent CT of abdomen and urine results. PCP requesting pt have [place] for further eval.

## 2011-02-05 NOTE — Progress Notes (Signed)
  Subjective:    Patient ID: Kerry Mcpherson, female    DOB: 04-Jun-1975, 35 y.o.   MRN: [id number]  HPI [name] reports she continues to have RlQ pelvic pain that has worsened over last 2 weeks.  She is S/P appy and prior imaging makes no mention of her ovaries.  Pain radiates throught to her back.  No N/V no fever, no dysruia no vaginal dischagre No Known Allergies Past Medical History  Diagnosis Date  . Lymphadenopathy     subcarinal  . Hypocalcemia     post-op total thyroidectomy  . Liver lesion, right lobe     on MRI 08/16/10 adenoma vs hyperplasia  . Thyroid nodule, toxic or with hyperthyroidism    Past Surgical History  Procedure Date  . Thyroidectomy   . Appendectomy 1997  . Abdominal hysterectomy 2005    partial  . Tubal ligation 2001   History   Social History  . Marital Status: Married    Spouse Name: N/A    Number of Children: 3  . Years of Education: N/A   Occupational History  . Student    Social History Main Topics  . Smoking status: Former Smoker -- 0.5 packs/day for 15 years    Types: Cigarettes    Quit date: 08/31/2010  . Smokeless tobacco: Never Used  . Alcohol Use: 0.5 oz/week    1 drink(s) per week  . Drug Use: No  . Sexually Active: Yes    Birth Control/ Protection: Surgical   Other Topics Concern  . Not on file   Social History Narrative  . No narrative on file   Family History  Problem Relation Age of Onset  . Breast cancer Mother   . Heart disease Father   . Colon cancer Maternal Grandmother    Patient Active Problem List  Diagnoses  . Thyroid nodule, toxic or with hyperthyroidism  . Lymphadenopathy  . Hypocalcemia  . Liver lesion, right lobe   Current Outpatient Prescriptions on File Prior to Visit  Medication Sig Dispense Refill  . levothyroxine (SYNTHROID, LEVOTHROID) 88 MCG tablet Take 88 mcg by mouth daily.        . calcium carbonate (TUMS - DOSED IN MG ELEMENTAL CALCIUM) 500 MG chewable tablet Chew 3 tablets by mouth 3  (three) times daily.              Review of Systems See HP    Objective:   Physical Exam Physical Exam  Nursing note and vitals reviewed.  Constitutional: She is oriented to person, place, and time. She appears well-developed and well-nourished.  HENT:  Head: Normocephalic and atraumatic.  Cardiovascular: Normal rate and regular rhythm. Exam reveals no gallop and no friction rub.  No murmur heard.  Pulmonary/Chest: Breath sounds normal. She has no wheezes. She has no rales. ABD:  Tender in RLQ  Pos rebounnd.  No HSM    Neurological: She is alert and oriented to person, place, and time.  Skin: Skin is warm and dry.  Psychiatric: She has a normal mood and affect. Her behavior is normal.         Assessment & Plan:  1)  RLQ pain  She is S/P multiple surgeries,  Appy, hysterectomy,  .  Will need stat labs and imaging of ovaries.  Will send To ER.  Spoke with [place] and pt transported via [employer]

## 2011-02-05 NOTE — Patient Instructions (Signed)
Pt transported to ER

## 2011-02-05 NOTE — ED Provider Notes (Signed)
History     CSN: [id number] Arrival date & time: 02/05/2011 11:51 AM   First MD Initiated Contact with Patient 02/05/11 1200      Chief Complaint  Patient presents with  . Abdominal Pain    (Consider location/radiation/quality/duration/timing/severity/associated sxs/prior treatment) HPI Comments: Pt states that she has been having the symptoms for 2.5 months:pt had a ct scan 2 months ago:pt states that she was seen by her pcp this morning and had a pelvic exam and her doctor sent her here for an ultrasound:pt c/o rlq pain  Patient is a 35 y.o. female presenting with abdominal pain. The history is provided by the patient. No language interpreter was used.  Abdominal Pain The primary symptoms of the illness include abdominal pain. The primary symptoms of the illness do not include fever, fatigue, nausea, vomiting or diarrhea. The current episode started more than 2 days ago. The onset of the illness was gradual. The problem has been gradually worsening.  The patient states that she believes she is currently not pregnant. The patient has not had a change in bowel habit. Risk factors for an acute abdominal problem include a history of abdominal surgery. Additional symptoms associated with the illness include urgency. Symptoms associated with the illness do not include hematuria or back pain.    Past Medical History  Diagnosis Date  . Lymphadenopathy     subcarinal  . Hypocalcemia     post-op total thyroidectomy  . Liver lesion, right lobe     on MRI 08/16/10 adenoma vs hyperplasia  . Thyroid nodule, toxic or with hyperthyroidism     Past Surgical History  Procedure Date  . Thyroidectomy   . Appendectomy 1997  . Abdominal hysterectomy 2005    partial  . Tubal ligation 2001    Family History  Problem Relation Age of Onset  . Breast cancer Mother   . Heart disease Father   . Colon cancer Maternal Grandmother     History  Substance Use Topics  . Smoking status: Former Smoker  -- 0.5 packs/day for 15 years    Types: Cigarettes    Quit date: 08/31/2010  . Smokeless tobacco: Never Used  . Alcohol Use: 0.5 oz/week    1 drink(s) per week    OB History    Grav Para Term Preterm Abortions TAB SAB Ect Mult Living   4 3   1            Review of Systems  Constitutional: Negative for fever and fatigue.  Gastrointestinal: Positive for abdominal pain. Negative for nausea, vomiting and diarrhea.  Genitourinary: Positive for urgency. Negative for hematuria.  Musculoskeletal: Negative for back pain.  All other systems reviewed and are negative.    Allergies  Review of patient's allergies indicates no known allergies.  Home Medications   Current Outpatient Rx  Name Route Sig Dispense Refill  . CALCIUM CARBONATE ANTACID 500 MG PO CHEW Oral Chew 3 tablets by mouth 3 (three) times daily.      Marland Kitchen LEVOTHYROXINE SODIUM 88 MCG PO TABS Oral Take 88 mcg by mouth daily.        BP 126/94  Pulse 78  Temp(Src) 98.1 F (36.7 C) (Oral)  Resp 20  SpO2 100%  Physical Exam  Nursing note and vitals reviewed. Constitutional: She is oriented to person, place, and time. She appears well-developed and well-nourished.  HENT:  Head: Normocephalic.  Neck: Normal range of motion. Neck supple.  Cardiovascular: Normal rate and regular rhythm.   Pulmonary/Chest:  Effort normal and breath sounds normal.  Abdominal: Soft. There is tenderness in the right upper quadrant and right lower quadrant.  Neurological: She is alert and oriented to person, place, and time.  Skin: Skin is warm and dry.  Psychiatric: She has a normal mood and affect.    ED Course  Procedures (including critical care time)   Labs Reviewed  CBC  COMPREHENSIVE METABOLIC PANEL  LIPASE, BLOOD   US Transvaginal Non-ob  02/05/2011  *RADIOLOGY REPORT*  Clinical Data: Lower quadrant pain.  Status post appendectomy. Post hysterectomy  TRANSABDOMINAL AND TRANSVAGINAL ULTRASOUND OF PELVIS Technique:  Both  transabdominal and transvaginal ultrasound examinations of the pelvis were performed. Transabdominal technique was performed for global imaging of the pelvis including vaginal cuff, ovaries, adnexal regions, and pelvic cul-de-sac.  Comparison: CT 11/2010   It was necessary to proceed with endovaginal exam following the transabdominal exam to visualize the vaginal cuff and adnexa.  Findings:  Uterus: Has been surgically removed.  A normal vaginal cuff is identified.  Endometrium: Not applicable  Right ovary:  Is seen only transabdominally and has a normal transabdominal appearance measuring 3.5 x 2.2 x 2.0 cm  Left ovary: Has a normal appearance measuring 3.0 x 1.9 x 2.0 cm  Other findings: No pelvic fluid or separate adnexal masses are suggested.  IMPRESSION: Unremarkable post hysterectomy pelvic ultrasound.  Original Report Authenticated By: Bertha Stakes, M.D.   US Pelvis Complete  02/05/2011  *RADIOLOGY REPORT*  Clinical Data: Lower quadrant pain.  Status post appendectomy. Post hysterectomy  TRANSABDOMINAL AND TRANSVAGINAL ULTRASOUND OF PELVIS Technique:  Both transabdominal and transvaginal ultrasound examinations of the pelvis were performed. Transabdominal technique was performed for global imaging of the pelvis including vaginal cuff, ovaries, adnexal regions, and pelvic cul-de-sac.  Comparison: CT 11/2010   It was necessary to proceed with endovaginal exam following the transabdominal exam to visualize the vaginal cuff and adnexa.  Findings:  Uterus: Has been surgically removed.  A normal vaginal cuff is identified.  Endometrium: Not applicable  Right ovary:  Is seen only transabdominally and has a normal transabdominal appearance measuring 3.5 x 2.2 x 2.0 cm  Left ovary: Has a normal appearance measuring 3.0 x 1.9 x 2.0 cm  Other findings: No pelvic fluid or separate adnexal masses are suggested.  IMPRESSION: Unremarkable post hysterectomy pelvic ultrasound.  Original Report Authenticated By:  Bertha Stakes, M.D.     1. Abdominal pain       MDM  No acute findings to explain pain:pt okay to follow up       Teressa Lower, NP 02/05/11 1449

## 2011-02-06 ENCOUNTER — Telehealth: Payer: Self-pay

## 2011-02-06 DIAGNOSIS — K769 Liver disease, unspecified: Secondary | ICD-10-CM

## 2011-02-06 NOTE — Telephone Encounter (Signed)
02/10/11 745 am arrival NPO after midnight [facility] pt aware

## 2011-02-06 NOTE — Telephone Encounter (Signed)
Left message on machine to call back  

## 2011-02-06 NOTE — Telephone Encounter (Signed)
Message copied by Donata Duff on Fri Feb 06, 2011 10:47 AM ------      Message from: Jessee Avers      Created: Tue Oct 07, 2010  9:56 AM       Repeat liver MRI 01/2011. See last office note.

## 2011-02-10 ENCOUNTER — Other Ambulatory Visit (HOSPITAL_COMMUNITY): Payer: BC Managed Care – PPO

## 2011-02-12 ENCOUNTER — Ambulatory Visit (HOSPITAL_COMMUNITY)
Admission: RE | Admit: 2011-02-12 | Discharge: 2011-02-12 | Disposition: A | Discharge status: Home / Self Care | Payer: BC Managed Care – PPO | Source: Ambulatory Visit | Attending: Gastroenterology | Admitting: Gastroenterology

## 2011-02-12 DIAGNOSIS — K769 Liver disease, unspecified: Secondary | ICD-10-CM

## 2011-02-12 DIAGNOSIS — N281 Cyst of kidney, acquired: Secondary | ICD-10-CM | POA: Insufficient documentation

## 2011-02-12 DIAGNOSIS — K7689 Other specified diseases of liver: Secondary | ICD-10-CM | POA: Insufficient documentation

## 2011-02-12 MED ORDER — GADOBENATE DIMEGLUMINE 529 MG/ML IV SOLN
15.0000 mL | Freq: Once | INTRAVENOUS | Status: AC | PRN
  Administered 2011-02-12: 15 mL via INTRAVENOUS

## 2011-03-04 ENCOUNTER — Telehealth: Payer: Self-pay | Admitting: Internal Medicine

## 2011-03-04 NOTE — Telephone Encounter (Signed)
Spoke with pt. To follow up on back and lower pelvic pain.  I do see that she saw [facility] and they did an MRI of abd/pelvis.  Imaging did not reveal obvious reason to explain her pain  She reports pain is now in lower back area and radiaties to Her R hip.  She takes Alleve for the discomfort.    I advised pt to see me in office for follow up.  Will evaluate to see if further diagnostic work up indicated.   She voices understanding and will make appt

## 2011-03-11 ENCOUNTER — Ambulatory Visit: Payer: BC Managed Care – PPO | Admitting: Internal Medicine

## 2011-03-12 ENCOUNTER — Ambulatory Visit (HOSPITAL_BASED_OUTPATIENT_CLINIC_OR_DEPARTMENT_OTHER)
Admission: RE | Admit: 2011-03-12 | Discharge: 2011-03-12 | Disposition: A | Discharge status: Home / Self Care | Payer: BC Managed Care – PPO | Source: Ambulatory Visit | Attending: Internal Medicine | Admitting: Internal Medicine

## 2011-03-12 ENCOUNTER — Ambulatory Visit (INDEPENDENT_AMBULATORY_CARE_PROVIDER_SITE_OTHER): Payer: BC Managed Care – PPO | Admitting: Internal Medicine

## 2011-03-12 ENCOUNTER — Encounter: Payer: Self-pay | Admitting: Internal Medicine

## 2011-03-12 DIAGNOSIS — M25559 Pain in unspecified hip: Secondary | ICD-10-CM

## 2011-03-12 DIAGNOSIS — M549 Dorsalgia, unspecified: Secondary | ICD-10-CM | POA: Insufficient documentation

## 2011-03-12 DIAGNOSIS — G9589 Other specified diseases of spinal cord: Secondary | ICD-10-CM

## 2011-03-12 DIAGNOSIS — K7689 Other specified diseases of liver: Secondary | ICD-10-CM

## 2011-03-12 DIAGNOSIS — M25551 Pain in right hip: Secondary | ICD-10-CM

## 2011-03-12 DIAGNOSIS — M545 Low back pain: Secondary | ICD-10-CM

## 2011-03-12 DIAGNOSIS — K769 Liver disease, unspecified: Secondary | ICD-10-CM

## 2011-03-12 NOTE — Patient Instructions (Signed)
To have X-rays today  Phone office Monday for results.

## 2011-03-12 NOTE — Progress Notes (Signed)
  Subjective:    Patient ID: Kerry Mcpherson, female    DOB: 07-09-1975, 36 y.o.   MRN: [id number]  HPI [name] is here for follow up.  She has since seen Dr. Christella Hartigan for liver lesion and was told to repeat MRI in one year per her report.    She continues to have R sided pain in groin area and now has some pain in R lumbar sacral area.  Work up of intra-abd pathology has been essentially neg.  She denies injury or trauma.  She takes occasional OTC NSAID.  She tried a massage in lower back and said she had pain in lumbar area.  Pain only worsens with movement.  She has been trying to exercise more and this is when she feels the pain  No Known Allergies Past Medical History  Diagnosis Date  . Lymphadenopathy     subcarinal  . Hypocalcemia     post-op total thyroidectomy  . Liver lesion, right lobe     on MRI 08/16/10 adenoma vs hyperplasia  . Thyroid nodule, toxic or with hyperthyroidism    Past Surgical History  Procedure Date  . Thyroidectomy   . Appendectomy 1997  . Abdominal hysterectomy 2005    partial  . Tubal ligation 2001   History   Social History  . Marital Status: Married    Spouse Name: N/A    Number of Children: 3  . Years of Education: N/A   Occupational History  . Student    Social History Main Topics  . Smoking status: Current Everyday Smoker -- 0.5 packs/day for 15 years    Types: Cigarettes  . Smokeless tobacco: Never Used  . Alcohol Use: 0.5 oz/week    1 drink(s) per week  . Drug Use: No  . Sexually Active: Yes    Birth Control/ Protection: Surgical   Other Topics Concern  . Not on file   Social History Narrative  . No narrative on file   Family History  Problem Relation Age of Onset  . Breast cancer Mother   . Heart disease Father   . Colon cancer Maternal Grandmother    Patient Active Problem List  Diagnoses  . Thyroid nodule, toxic or with hyperthyroidism  . Lymphadenopathy  . Hypocalcemia  . Liver lesion, right lobe   Current Outpatient  Prescriptions on File Prior to Visit  Medication Sig Dispense Refill  . levothyroxine (SYNTHROID, LEVOTHROID) 88 MCG tablet Take 88 mcg by mouth daily.              Review of Systems    see HPI Objective:   Physical Exam Physical Exam  Nursing note and vitals reviewed.  Constitutional: She is oriented to person, place, and time. She appears well-developed and well-nourished.  HENT:  Head: Normocephalic and atraumatic.  Cardiovascular: Normal rate and regular rhythm. Exam reveals no gallop and no friction rub.  No murmur heard.  Pulmonary/Chest: Breath sounds normal. She has no wheezes. She has no rales.  Neurological: She is alert and oriented to person, place, and time.  Skin: Skin is warm and dry.  Psychiatric: She has a normal mood and affect. Her behavior is normal.  M/S:  R hip  Pain with rotation located in groin area.  No pain with F/E  Minor pain R side L/S area        Assessment & Plan:  1)

## 2011-03-16 ENCOUNTER — Telehealth: Payer: Self-pay | Admitting: Internal Medicine

## 2011-03-16 DIAGNOSIS — M25559 Pain in unspecified hip: Secondary | ICD-10-CM

## 2011-03-16 NOTE — Telephone Encounter (Signed)
Spoke with pt.  And reviewed results of X-ray's  Minimal sclerosis at L5 S1  I am not convinced this is causing her discomfort but it is OK to get ortho opinion.  Will set up with Dr. Charlett Blake.

## 2012-01-20 ENCOUNTER — Ambulatory Visit (INDEPENDENT_AMBULATORY_CARE_PROVIDER_SITE_OTHER): Payer: BC Managed Care – PPO | Admitting: Internal Medicine

## 2012-01-20 ENCOUNTER — Encounter: Payer: Self-pay | Admitting: Internal Medicine

## 2012-01-20 VITALS — BP 127/79 | HR 85 | Temp 97.8°F | Resp 18 | Ht 61.0 in | Wt 152.0 lb

## 2012-01-20 DIAGNOSIS — Z9889 Other specified postprocedural states: Secondary | ICD-10-CM

## 2012-01-20 DIAGNOSIS — F17200 Nicotine dependence, unspecified, uncomplicated: Secondary | ICD-10-CM

## 2012-01-20 DIAGNOSIS — Z90710 Acquired absence of both cervix and uterus: Secondary | ICD-10-CM | POA: Insufficient documentation

## 2012-01-20 DIAGNOSIS — E890 Postprocedural hypothyroidism: Secondary | ICD-10-CM

## 2012-01-20 DIAGNOSIS — R413 Other amnesia: Secondary | ICD-10-CM

## 2012-01-20 DIAGNOSIS — Z720 Tobacco use: Secondary | ICD-10-CM | POA: Insufficient documentation

## 2012-01-20 DIAGNOSIS — E039 Hypothyroidism, unspecified: Secondary | ICD-10-CM

## 2012-01-20 MED ORDER — LEVOTHYROXINE SODIUM 88 MCG PO TABS: 88.0000 ug | ORAL_TABLET | Freq: Every day | ORAL | Status: DC

## 2012-01-20 NOTE — Progress Notes (Signed)
  Subjective:    Patient ID: Kerry Mcpherson, female    DOB: [birth year], 36 y.o.   MRN: [id number]  HPI Faryn is here for acute visit.   She is concerned over problem with her recent memory and states she has trouble concentrating.    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  She stopped taking her synthroid around June of this year.  "I felt better without it"  Denes constipation, weight gain, edema, palpitaitons, or dry skin.    No Known Allergies Past Medical History  Diagnosis Date  . Lymphadenopathy     subcarinal  . Hypocalcemia     post-op total thyroidectomy  . Liver lesion, right lobe     on MRI 08/16/10 adenoma vs hyperplasia  . Thyroid nodule, toxic or with hyperthyroidism    Past Surgical History  Procedure Date  . Thyroidectomy   . Appendectomy 1997  . Abdominal hysterectomy 2005    partial  . Tubal ligation 2001   History   Social History  . Marital Status: Married    Spouse Name: N/A    Number of Children: 3  . Years of Education: N/A   Occupational History  . Student    Social History Main Topics  . Smoking status: Current Every Day Smoker -- 0.5 packs/day for 15 years    Types: Cigarettes  . Smokeless tobacco: Never Used  . Alcohol Use: 0.5 oz/week    1 drink(s) per week  . Drug Use: No  . Sexually Active: Yes    Birth Control/ Protection: Surgical   Other Topics Concern  . Not on file   Social History Narrative  . No narrative on file   Family History  Problem Relation Age of Onset  . Breast cancer Mother   . Heart disease Father   . Colon cancer Maternal Grandmother    Patient Active Problem List  Diagnosis  . Thyroid nodule, toxic or with hyperthyroidism  . Lymphadenopathy  . Hypocalcemia  . Liver lesion, right lobe  . S/P total thyroidectomy  . S/P hysterectomy  . Hypothyroidism  . Tobacco use  . Memory disorder   No current outpatient prescriptions on file prior to visit.       Review of  Systems See Hpi    Objective:   Physical Exam Physical Exam  Nursing note and vitals reviewed.  Constitutional: She is oriented to person, place, and time. She appears well-developed and well-nourished.  HENT:  Head: Normocephalic and atraumatic.  Cardiovascular: Normal rate and regular rhythm. Exam reveals no gallop and no friction rub.  No murmur heard.  Pulmonary/Chest: Breath sounds normal. She has no wheezes. She has no rales.  Neurological: She is alert and oriented to person, place, and time.  Skin: Skin is warm and dry.  Psychiatric: She has a normal mood and affect. Her behavior is normal.              Assessment & Plan:  Hypothyroidism:  She has been without thyroid replacement for  approx 5 months.  Will restart her synthroid and check TSH with cbc and chemistries.  EKG today no acute changes.  I counseled of the importance of taking daily thyroid med for the rest of her life.  She voices understanding  See me in 6-8 weeks  Memory/concentration disorder:  Suspect lack of thyroid hormone affecting this.  May need neurology opinion if this does not improve after replacement

## 2012-01-21 LAB — LIPID PANEL
Cholesterol: 193 mg/dL (ref 0–200)
LDL Cholesterol: 118 mg/dL — ABNORMAL HIGH (ref 0–99)
Triglycerides: 74 mg/dL (ref <150)
VLDL: 15 mg/dL (ref 0–40)

## 2012-01-21 LAB — COMPREHENSIVE METABOLIC PANEL
ALT: 15 U/L (ref 0–35)
Albumin: 4.7 g/dL (ref 3.5–5.2)
CO2: 27 mEq/L (ref 19–32)
Glucose, Bld: 82 mg/dL (ref 70–99)
Potassium: 4.6 mEq/L (ref 3.5–5.3)
Sodium: 134 mEq/L — ABNORMAL LOW (ref 135–145)
Total Protein: 7.5 g/dL (ref 6.0–8.3)

## 2012-01-21 LAB — CBC WITH DIFFERENTIAL/PLATELET
Hemoglobin: 14.6 g/dL (ref 12.0–15.0)
Lymphocytes Relative: 40 % (ref 12–46)
Lymphs Abs: 2.8 10*3/uL (ref 0.7–4.0)
Monocytes Relative: 5 % (ref 3–12)
Neutro Abs: 3.8 10*3/uL (ref 1.7–7.7)
Neutrophils Relative %: 53 % (ref 43–77)
Platelets: 238 10*3/uL (ref 150–400)
RBC: 4.63 MIL/uL (ref 3.87–5.11)
WBC: 7.1 10*3/uL (ref 4.0–10.5)

## 2012-01-21 LAB — TSH: TSH: 6.21 u[IU]/mL — ABNORMAL HIGH (ref 0.350–4.500)

## 2012-02-08 ENCOUNTER — Encounter: Payer: Self-pay | Admitting: *Deleted

## 2012-02-08 ENCOUNTER — Telehealth: Payer: Self-pay | Admitting: *Deleted

## 2012-02-08 NOTE — Telephone Encounter (Signed)
Message copied by Mathews Robinsons on Mon Feb 08, 2012  1:51 PM ------      Message from: Raechel Chute D      Created: Mon Feb 08, 2012  8:38 AM       Ok to mail labs to pt

## 2012-02-08 NOTE — Telephone Encounter (Signed)
Message copied by Mathews Robinsons on Mon Feb 08, 2012  1:41 PM ------      Message from: Raechel Chute D      Created: Mon Feb 08, 2012  8:41 AM       Karen Kitchens call pt and let her know that her thyroid blood test will be mailed to her.  It is abnormal as we expected.  I note she does not have a follow up appt with me.  Give her an appt for 6 weeks.               Message back with date

## 2012-02-08 NOTE — Telephone Encounter (Signed)
Pt notified of lab results and appt made for 03/21/12 at 830

## 2012-02-08 NOTE — Telephone Encounter (Signed)
Lab results mailed to pt home address

## 2012-02-15 ENCOUNTER — Telehealth: Payer: Self-pay

## 2012-02-15 NOTE — Telephone Encounter (Signed)
Message copied by Donata Duff on Mon Feb 15, 2012  9:16 AM ------      Message from: Donata Duff      Created: Fri Feb 13, 2011  8:35 AM       Pt needs MRI see 02/13/11 result note

## 2012-02-19 NOTE — Telephone Encounter (Signed)
Left message on machine to call back  

## 2012-02-22 NOTE — Telephone Encounter (Signed)
Left message on machine to call back  

## 2012-02-25 NOTE — Telephone Encounter (Signed)
Left message on machine to call back letter mailed. 

## 2012-03-21 ENCOUNTER — Ambulatory Visit: Payer: BC Managed Care – PPO | Admitting: Internal Medicine

## 2012-03-22 ENCOUNTER — Encounter: Payer: Self-pay | Admitting: Internal Medicine

## 2012-03-22 ENCOUNTER — Ambulatory Visit (INDEPENDENT_AMBULATORY_CARE_PROVIDER_SITE_OTHER): Payer: BC Managed Care – PPO | Admitting: Internal Medicine

## 2012-03-22 VITALS — BP 110/70 | HR 81 | Temp 97.0°F | Resp 18 | Wt 152.0 lb

## 2012-03-22 DIAGNOSIS — R413 Other amnesia: Secondary | ICD-10-CM

## 2012-03-22 DIAGNOSIS — E890 Postprocedural hypothyroidism: Secondary | ICD-10-CM

## 2012-03-22 DIAGNOSIS — E039 Hypothyroidism, unspecified: Secondary | ICD-10-CM

## 2012-03-22 DIAGNOSIS — Z23 Encounter for immunization: Secondary | ICD-10-CM

## 2012-03-22 DIAGNOSIS — Z9889 Other specified postprocedural states: Secondary | ICD-10-CM

## 2012-03-22 DIAGNOSIS — Z139 Encounter for screening, unspecified: Secondary | ICD-10-CM

## 2012-03-22 MED ORDER — LEVOTHYROXINE SODIUM 88 MCG PO TABS: 88.0000 ug | ORAL_TABLET | Freq: Every day | ORAL | Status: DC

## 2012-03-22 NOTE — Progress Notes (Signed)
Subjective:    Patient ID: Kerry Mcpherson, female    DOB: 05-09-1975, 37 y.o.   MRN: [id number]  HPI [name] returns for follow up after reinstitution of her Levothyroxine.  She reports she is taking daily.    She believes her memory issues may be related to ADD.  She was never diagnosed as a child but she had distractability, problems focusing and difficulty changing to new tasks.  She was an average student "but I know I could have done better"  She has a new job and feels she should be having better perfomance.  She would like to be evaluated for ADD  No Known Allergies Past Medical History  Diagnosis Date  . Lymphadenopathy     subcarinal  . Hypocalcemia     post-op total thyroidectomy  . Liver lesion, right lobe     on MRI 08/16/10 adenoma vs hyperplasia  . Thyroid nodule, toxic or with hyperthyroidism    Past Surgical History  Procedure Date  . Thyroidectomy   . Appendectomy 1997  . Abdominal hysterectomy 2005    partial  . Tubal ligation 2001   History   Social History  . Marital Status: Married    Spouse Name: N/A    Number of Children: 3  . Years of Education: N/A   Occupational History  . Student    Social History Main Topics  . Smoking status: Current Every Day Smoker -- 0.5 packs/day for 15 years    Types: Cigarettes  . Smokeless tobacco: Never Used  . Alcohol Use: 0.5 oz/week    1 drink(s) per week  . Drug Use: No  . Sexually Active: Yes    Birth Control/ Protection: Surgical   Other Topics Concern  . Not on file   Social History Narrative  . No narrative on file   Family History  Problem Relation Age of Onset  . Breast cancer Mother   . Heart disease Father   . Colon cancer Maternal Grandmother    Patient Active Problem List  Diagnosis  . Thyroid nodule, toxic or with hyperthyroidism  . Lymphadenopathy  . Hypocalcemia  . Liver lesion, right lobe  . S/P total thyroidectomy  . S/P hysterectomy  . Hypothyroidism  . Tobacco use  . Memory  disorder   No current outpatient prescriptions on file prior to visit.      Review of Systems See HPI    Objective:   Physical Exam Physical Exam  Constitutional: She is oriented to person, place, and time. She appears well-developed and well-nourished. She is cooperative.  HENT:  Head: Normocephalic and atraumatic.  Right Ear: A middle ear effusion is present.  Left Ear: A middle ear effusion is present.  Nose: Mucosal edema present.  Mouth/Throat: Oropharyngeal exudate and posterior oropharyngeal erythema present.  Serous effusion bilaterally  Eyes: Conjunctivae and EOM are normal. Pupils are equal, round, and reactive to light.  Neck: Neck supple. Carotid bruit is not present. No mass present.  Cardiovascular: Regular rhythm, normal heart sounds, intact distal pulses and normal pulses. Exam reveals no gallop and no friction rub.  No murmur heard.  Pulmonary/Chest: Breath sounds normal. She has no wheezes. She has no rhonchi. She has no rales.  Lymphadenopathy:  She has cervical adenopathy.  Neurological: She is alert and oriented to person, place, and time.  Skin: Skin is warm and dry. No abrasion, no bruising, no ecchymosis and no rash noted. No cyanosis. Nails show no clubbing.  Psychiatric: She has a normal  mood and affect. Her speech is normal and behavior is normal.       Assessment & Plan:  Hypothyroidism  S/P total thyroidectomy:  Will check TSH today  Continue levothyroxine  Attention/distractability:   I gave pt number to Dr. Nolen Mu, Dr. Evelene Croon and [facility] she is to call for psychiatric eval.    See me as needed  She will get Tdap today

## 2012-03-22 NOTE — Patient Instructions (Addendum)
Labs will be mailed to you  See me as needed

## 2012-03-23 ENCOUNTER — Telehealth: Payer: Self-pay | Admitting: Internal Medicine

## 2012-03-23 MED ORDER — LEVOTHYROXINE SODIUM 100 MCG PO TABS: 100.0000 ug | ORAL_TABLET | Freq: Every day | ORAL | Status: AC

## 2012-03-23 NOTE — Telephone Encounter (Signed)
Kerry Mcpherson    Call pt and let her know that I increased her thyroid medication to 100 mcg.  She can pick up downstairs.  Give her an appt in 10 weeks so I can see how she is doing and willl recheck her thyroid level at that time

## 2012-03-24 NOTE — Telephone Encounter (Signed)
Notified pt of increase in meds f/u appt made for 3/24

## 2012-03-31 ENCOUNTER — Encounter: Payer: Self-pay | Admitting: *Deleted

## 2012-05-23 ENCOUNTER — Ambulatory Visit: Payer: BC Managed Care – PPO | Admitting: Internal Medicine

## 2012-05-25 ENCOUNTER — Ambulatory Visit (INDEPENDENT_AMBULATORY_CARE_PROVIDER_SITE_OTHER): Payer: BC Managed Care – PPO | Admitting: Internal Medicine

## 2012-05-25 ENCOUNTER — Encounter: Payer: Self-pay | Admitting: Internal Medicine

## 2012-05-25 ENCOUNTER — Telehealth: Payer: Self-pay | Admitting: Internal Medicine

## 2012-05-25 VITALS — BP 111/73 | HR 87 | Temp 98.2°F | Resp 16 | Ht 61.0 in | Wt 154.0 lb

## 2012-05-25 DIAGNOSIS — R413 Other amnesia: Secondary | ICD-10-CM

## 2012-05-25 DIAGNOSIS — E039 Hypothyroidism, unspecified: Secondary | ICD-10-CM

## 2012-05-25 DIAGNOSIS — R002 Palpitations: Secondary | ICD-10-CM

## 2012-05-25 LAB — T3, FREE: T3, Free: 3.3 pg/mL (ref 2.3–4.2)

## 2012-05-25 LAB — TSH: TSH: 1.431 u[IU]/mL (ref 0.350–4.500)

## 2012-05-25 LAB — T4, FREE: Free T4: 1.6 ng/dL (ref 0.80–1.80)

## 2012-05-25 NOTE — Progress Notes (Signed)
  Subjective:    Patient ID: Kerry Mcpherson, female    DOB: 09/20/1975, 37 y.o.   MRN: [id number]  HPI Kerry Mcpherson is here for follow up   She has had her dose of Synthroid increased to 100 mcg daily.    Last 4 weeks  She has noted palpitations that began 4 weeks ago at work.  She was anxious at the time but she has also noted a couple of episodes at home sitting on couch when she is not anxious. No visual changes  No tremor      No Known Allergies Past Medical History  Diagnosis Date  . Lymphadenopathy     subcarinal  . Hypocalcemia     post-op total thyroidectomy  . Liver lesion, right lobe     on MRI 08/16/10 adenoma vs hyperplasia  . Thyroid nodule, toxic or with hyperthyroidism    Past Surgical History  Procedure Laterality Date  . Thyroidectomy    . Appendectomy  1997  . Abdominal hysterectomy  2005    partial  . Tubal ligation  2001   History   Social History  . Marital Status: Married    Spouse Name: N/A    Number of Children: 3  . Years of Education: N/A   Occupational History  . Student    Social History Main Topics  . Smoking status: Current Every Day Smoker -- 0.50 packs/day for 15 years    Types: Cigarettes  . Smokeless tobacco: Never Used  . Alcohol Use: 0.5 oz/week    1 drink(s) per week  . Drug Use: No  . Sexually Active: Yes    Birth Control/ Protection: Surgical   Other Topics Concern  . Not on file   Social History Narrative  . No narrative on file   Family History  Problem Relation Age of Onset  . Breast cancer Mother   . Heart disease Father   . Colon cancer Maternal Grandmother    Patient Active Problem List  Diagnosis  . Thyroid nodule, toxic or with hyperthyroidism  . Lymphadenopathy  . Hypocalcemia  . Liver lesion, right lobe  . S/P total thyroidectomy  . S/P hysterectomy  . Hypothyroidism  . Tobacco use  . Memory disorder   Current Outpatient Prescriptions on File Prior to Visit  Medication Sig Dispense Refill  .  levothyroxine (SYNTHROID) 100 MCG tablet Take 1 tablet (100 mcg total) by mouth daily.  90 tablet  1   No current facility-administered medications on file prior to visit.       Review of Systems See HPI    Objective:   Physical Exam Physical Exam  Nursing note and vitals reviewed.  Constitutional: She is oriented to person, place, and time. She appears well-developed and well-nourished.  HENT:  Head: Normocephalic and atraumatic.  Cardiovascular: Normal rate and regular rhythm. Exam reveals no gallop and no friction rub.  No murmur heard.  Pulmonary/Chest: Breath sounds normal. She has no wheezes. She has no rales.  Neurological: She is alert and oriented to person, place, and time.  Skin: Skin is warm and dry.  Psychiatric: She has a normal mood and affect. Her behavior is normal.        Assessment & Plan:  Palpitations  EKG today NSR normal rate   Will check TSH  Hypothyroidism  See above

## 2012-05-26 ENCOUNTER — Telehealth: Payer: Self-pay | Admitting: *Deleted

## 2012-05-26 ENCOUNTER — Encounter: Payer: Self-pay | Admitting: *Deleted

## 2012-05-26 NOTE — Telephone Encounter (Signed)
LVM regarding her lab results and medication

## 2012-05-26 NOTE — Telephone Encounter (Signed)
Message copied by Mathews Robinsons on Thu May 26, 2012  4:49 PM ------      Message from: Raechel Chute D      Created: Thu May 26, 2012  8:11 AM       Karen Kitchens              Call pt and let her know that her thyroid blood work is right where it needs to be.  Keep on same dose.  Palpitations may be coming from something else            If palpitations more frequent or lasting longer  See me inoffice            Ok to mail labs to pt ------

## 2012-10-20 ENCOUNTER — Ambulatory Visit (INDEPENDENT_AMBULATORY_CARE_PROVIDER_SITE_OTHER): Payer: BC Managed Care – PPO | Admitting: Family Medicine

## 2012-10-20 ENCOUNTER — Encounter: Payer: Self-pay | Admitting: Family Medicine

## 2012-10-20 VITALS — BP 123/86 | HR 86 | Ht 61.0 in | Wt 145.0 lb

## 2012-10-20 DIAGNOSIS — S8990XA Unspecified injury of unspecified lower leg, initial encounter: Secondary | ICD-10-CM

## 2012-10-20 DIAGNOSIS — S8992XA Unspecified injury of left lower leg, initial encounter: Secondary | ICD-10-CM

## 2012-10-20 NOTE — Patient Instructions (Addendum)
You have subluxed your patella (almost dislocated kneecap) - less likely a lateral meniscal tear. Wear immobilizer at all times except when lying down (consider wearing it when sleeping), bathing, icing. Straight leg raises 3 sets of 10 once a day (can add ankle weight if this becomes too easy). Icing 15 minutes at a time 3-4 times a day. Elevation, ace wrap for compression if swelling recurs. Ibuprofen 600mg  three times a day with food for pain and inflammation. Follow up with me in just under 2 weeks for reevaluation. Use crutches as needed. Out of work in meantime.

## 2012-10-21 ENCOUNTER — Encounter: Payer: Self-pay | Admitting: Family Medicine

## 2012-10-21 DIAGNOSIS — S8992XA Unspecified injury of left lower leg, initial encounter: Secondary | ICD-10-CM | POA: Insufficient documentation

## 2012-10-21 NOTE — Progress Notes (Signed)
Patient ID: Kerry Mcpherson, female   DOB: 1976-02-11, 37 y.o.   MRN: [id number]  PCP: Levon Hedger, MD  Subjective:   HPI: Patient is a 37 y.o. female here for left knee injury.  Patient reports on Sunday 8/17 she was playing cornhole with her husband. She accidentally twisted and fell down with a pop felt in left knee. Has prior history of several patellar dislocations - this felt somewhat different. Difficulty walking as a result. Went to [facility] - x-rays negative - put on crutches. 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 Taking ibuprofen. Feels like knee will give out on her. Lots of swelling initially - has improved a lot since then.  Past Medical History  Diagnosis Date  . Lymphadenopathy     subcarinal  . Hypocalcemia     post-op total thyroidectomy  . Liver lesion, right lobe     on MRI 08/16/10 adenoma vs hyperplasia  . Thyroid nodule, toxic or with hyperthyroidism     Current Outpatient Prescriptions on File Prior to Visit  Medication Sig Dispense Refill  . levothyroxine (SYNTHROID) 100 MCG tablet Take 1 tablet (100 mcg total) by mouth daily.  90 tablet  1   No current facility-administered medications on file prior to visit.    Past Surgical History  Procedure Laterality Date  . Thyroidectomy    . Appendectomy  1997  . Abdominal hysterectomy  2005    partial  . Tubal ligation  2001    No Known Allergies  History   Social History  . Marital Status: Married    Spouse Name: N/A    Number of Children: 3  . Years of Education: N/A   Occupational History  . Student    Social History Main Topics  . Smoking status: Current Every Day Smoker -- 0.50 packs/day for 15 years    Types: Cigarettes  . Smokeless tobacco: Never Used  . Alcohol Use: 0.5 oz/week    1 drink(s) per week  . Drug Use: No  . Sexual Activity: Yes    Birth Control/ Protection: Surgical   Other Topics Concern  . Not on file   Social History Narrative  . No  narrative on file    Family History  Problem Relation Age of Onset  . Breast cancer Mother   . Heart disease Father   . Heart attack Father   . Hypertension Father   . Colon cancer Maternal Grandmother   . Hyperlipidemia Neg Hx   . Diabetes Neg Hx   . Sudden death Neg Hx     BP 123/86  Pulse 86  Ht 5\' 1"  (1.549 m)  Wt 145 lb (65.772 kg)  BMI 27.41 kg/m2  Review of Systems: See HPI above.    Objective:  Physical Exam:  Gen: NAD  Left knee: Mild swelling. No bruising, other deformity.  Hypermobile patella. TTP lateral post patellar facet mainly though with less lat joint line tenderness. ROM 0 - 90 degrees. Negative ant/post drawers. Negative valgus/varus testing. Negative lachmanns. Mild lateral pain with mcmurrays, apleys, patellar apprehension. NV intact distally.    Assessment & Plan:  1. Left knee injury - Given her history and current exam suspect she's had a patellar subluxation though other testing indicates possible lateral meniscal tear.  Will treat conservatively with subluxation protocol.  Immobilizer for next 2-3 weeks.  Ok to take off to ice, bathe.  Icing 3-4 times a day, ibuprofen.  Elevation, ace wrap.  F/u at  2 weeks for reevaluation.  Out of work in meantime.

## 2012-10-21 NOTE — Assessment & Plan Note (Signed)
Given her history and current exam suspect she's had a patellar subluxation though other testing indicates possible lateral meniscal tear.  Will treat conservatively with subluxation protocol.  Immobilizer for next 2-3 weeks.  Ok to take off to ice, bathe.  Icing 3-4 times a day, ibuprofen.  Elevation, ace wrap.  F/u at 2 weeks for reevaluation.  Out of work in meantime.

## 2012-10-25 ENCOUNTER — Encounter: Payer: BC Managed Care – PPO | Admitting: Sports Medicine

## 2012-11-01 ENCOUNTER — Encounter: Payer: Self-pay | Admitting: Family Medicine

## 2012-11-01 ENCOUNTER — Ambulatory Visit (INDEPENDENT_AMBULATORY_CARE_PROVIDER_SITE_OTHER): Payer: BC Managed Care – PPO | Admitting: Family Medicine

## 2012-11-01 VITALS — BP 125/81 | HR 85 | Ht 61.0 in | Wt 145.0 lb

## 2012-11-01 DIAGNOSIS — S83002D Unspecified subluxation of left patella, subsequent encounter: Secondary | ICD-10-CM

## 2012-11-01 DIAGNOSIS — Z5189 Encounter for other specified aftercare: Secondary | ICD-10-CM

## 2012-11-01 DIAGNOSIS — S8992XD Unspecified injury of left lower leg, subsequent encounter: Secondary | ICD-10-CM

## 2012-11-01 NOTE — Patient Instructions (Addendum)
Continue ibuprofen as needed. Be careful when bending the knee especially past 90 degrees. Knee brace for support. Icing 15 minutes at a time 3-4 times a day as needed for pain, swelling. Start physical therapy and do exercises on days you don't go to therapy. Follow up with me in 4 weeks.

## 2012-11-03 ENCOUNTER — Encounter: Payer: Self-pay | Admitting: Family Medicine

## 2012-11-03 NOTE — Progress Notes (Signed)
Patient ID: Kerry Mcpherson, female   DOB: 1975-10-10, 37 y.o.   MRN: [id number]  PCP: Levon Hedger, MD  Subjective:   HPI: Patient is a 37 y.o. female here for left knee injury.  8/21: Patient reports on Sunday 8/17 she was playing cornhole with her husband. She accidentally twisted and fell down with a pop felt in left knee. Has prior history of several patellar dislocations - this felt somewhat different. Difficulty walking as a result. Went to [facility] - x-rays negative - put on crutches. 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 Taking ibuprofen. Feels like knee will give out on her. Lots of swelling initially - has improved a lot since then.  9/2: Overall patient is improving. Used immobilizer until yesterday. Some catching, clicking since then. Slight swelling. With steps feels like it's going to give out. Doing quad strengthening exercises. Icing, taking ibuprofen.  Past Medical History  Diagnosis Date  . Lymphadenopathy     subcarinal  . Hypocalcemia     post-op total thyroidectomy  . Liver lesion, right lobe     on MRI 08/16/10 adenoma vs hyperplasia  . Thyroid nodule, toxic or with hyperthyroidism     Current Outpatient Prescriptions on File Prior to Visit  Medication Sig Dispense Refill  . levothyroxine (SYNTHROID) 100 MCG tablet Take 1 tablet (100 mcg total) by mouth daily.  90 tablet  1   No current facility-administered medications on file prior to visit.    Past Surgical History  Procedure Laterality Date  . Thyroidectomy    . Appendectomy  1997  . Abdominal hysterectomy  2005    partial  . Tubal ligation  2001    No Known Allergies  History   Social History  . Marital Status: Married    Spouse Name: N/A    Number of Children: 3  . Years of Education: N/A   Occupational History  . Student    Social History Main Topics  . Smoking status: Current Every Day Smoker -- 0.50 packs/day for 15 years    Types: Cigarettes   . Smokeless tobacco: Never Used  . Alcohol Use: 0.5 oz/week    1 drink(s) per week  . Drug Use: No  . Sexual Activity: Yes    Birth Control/ Protection: Surgical   Other Topics Concern  . Not on file   Social History Narrative  . No narrative on file    Family History  Problem Relation Age of Onset  . Breast cancer Mother   . Heart disease Father   . Heart attack Father   . Hypertension Father   . Colon cancer Maternal Grandmother   . Hyperlipidemia Neg Hx   . Diabetes Neg Hx   . Sudden death Neg Hx     BP 125/81  Pulse 85  Ht 5\' 1"  (1.549 m)  Wt 145 lb (65.772 kg)  BMI 27.41 kg/m2  Review of Systems: See HPI above.    Objective:  Physical Exam:  Gen: NAD  Left knee: Mild swelling. No bruising, other deformity.  Hypermobile patella. TTP lateral post patellar facet.  No longer with lat joint line TTP. ROM 0 - 90 degrees. Negative ant/post drawers. Negative valgus/varus testing. Negative lachmanns. Negative with mcmurrays, apleys.  Mild + patellar apprehension. NV intact distally.    Assessment & Plan:  1. Left knee injury - Given her history and current exam suspect she's had a patellar subluxation - meniscal testing now negative and no joint  line tenderness.  Start physical therapy.  Ibuprofen, knee brace, icing, home exercises.  F/u in 4 weeks.

## 2012-11-03 NOTE — Assessment & Plan Note (Signed)
Given her history and current exam suspect she's had a patellar subluxation - meniscal testing now negative and no joint line tenderness.  Start physical therapy.  Ibuprofen, knee brace, icing, home exercises.  F/u in 4 weeks.

## 2012-11-07 ENCOUNTER — Ambulatory Visit: Payer: BC Managed Care – PPO | Attending: Family Medicine | Admitting: Rehabilitation

## 2012-11-07 DIAGNOSIS — IMO0001 Reserved for inherently not codable concepts without codable children: Secondary | ICD-10-CM | POA: Insufficient documentation

## 2012-11-07 DIAGNOSIS — M25569 Pain in unspecified knee: Secondary | ICD-10-CM | POA: Insufficient documentation

## 2012-11-09 ENCOUNTER — Ambulatory Visit: Payer: BC Managed Care – PPO | Admitting: Rehabilitation

## 2012-11-14 ENCOUNTER — Ambulatory Visit: Payer: BC Managed Care – PPO | Admitting: Rehabilitation

## 2012-11-16 ENCOUNTER — Ambulatory Visit: Payer: BC Managed Care – PPO | Admitting: Rehabilitation

## 2012-11-21 ENCOUNTER — Ambulatory Visit: Payer: BC Managed Care – PPO | Admitting: Rehabilitation

## 2012-11-28 ENCOUNTER — Ambulatory Visit: Payer: BC Managed Care – PPO | Admitting: Rehabilitation

## 2012-11-29 ENCOUNTER — Encounter: Payer: Self-pay | Admitting: Family Medicine

## 2012-11-29 ENCOUNTER — Ambulatory Visit (INDEPENDENT_AMBULATORY_CARE_PROVIDER_SITE_OTHER): Payer: BC Managed Care – PPO | Admitting: Family Medicine

## 2012-11-29 VITALS — BP 119/85 | HR 80 | Ht 61.0 in | Wt 150.0 lb

## 2012-11-29 DIAGNOSIS — Z5189 Encounter for other specified aftercare: Secondary | ICD-10-CM

## 2012-11-29 DIAGNOSIS — S8992XD Unspecified injury of left lower leg, subsequent encounter: Secondary | ICD-10-CM

## 2012-11-29 NOTE — Patient Instructions (Addendum)
Advised to continue HEP for next 6 weeks, follow up as needed.

## 2012-11-29 NOTE — Progress Notes (Signed)
Patient ID: Kerry Mcpherson, female   DOB: May 11, 1975, 37 y.o.   MRN: [id number]  PCP: Levon Hedger, MD  Subjective:   HPI: Patient is a 37 y.o. female here for left knee injury.  8/21: Patient reports on Sunday 8/17 she was playing cornhole with her husband. She accidentally twisted and fell down with a pop felt in left knee. Has prior history of several patellar dislocations - this felt somewhat different. Difficulty walking as a result. Went to [facility] - x-rays negative - put on crutches. 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 Taking ibuprofen. Feels like knee will give out on her. Lots of swelling initially - has improved a lot since then.  9/2: Overall patient is improving. Used immobilizer until yesterday. Some catching, clicking since then. Slight swelling. With steps feels like it's going to give out. Doing quad strengthening exercises. Icing, taking ibuprofen.  9/30: Patient is much better following PT, HEP. Once in a while gets a catch in knee. No locking, giving out. Does not need any medications though using patches from PT. Doing HEP regularly.  Past Medical History  Diagnosis Date  . Lymphadenopathy     subcarinal  . Hypocalcemia     post-op total thyroidectomy  . Liver lesion, right lobe     on MRI 08/16/10 adenoma vs hyperplasia  . Thyroid nodule, toxic or with hyperthyroidism     Current Outpatient Prescriptions on File Prior to Visit  Medication Sig Dispense Refill  . levothyroxine (SYNTHROID) 100 MCG tablet Take 1 tablet (100 mcg total) by mouth daily.  90 tablet  1   No current facility-administered medications on file prior to visit.    Past Surgical History  Procedure Laterality Date  . Thyroidectomy    . Appendectomy  1997  . Abdominal hysterectomy  2005    partial  . Tubal ligation  2001    No Known Allergies  History   Social History  . Marital Status: Married    Spouse Name: N/A    Number of Children:  3  . Years of Education: N/A   Occupational History  . Student    Social History Main Topics  . Smoking status: Current Every Day Smoker -- 0.50 packs/day for 15 years    Types: Cigarettes  . Smokeless tobacco: Never Used  . Alcohol Use: 0.5 oz/week    1 drink(s) per week  . Drug Use: No  . Sexual Activity: Yes    Birth Control/ Protection: Surgical   Other Topics Concern  . Not on file   Social History Narrative  . No narrative on file    Family History  Problem Relation Age of Onset  . Breast cancer Mother   . Heart disease Father   . Heart attack Father   . Hypertension Father   . Colon cancer Maternal Grandmother   . Hyperlipidemia Neg Hx   . Diabetes Neg Hx   . Sudden death Neg Hx     BP 119/85  Pulse 80  Ht 5\' 1"  (1.549 m)  Wt 150 lb (68.04 kg)  BMI 28.36 kg/m2  Review of Systems: See HPI above.    Objective:  Physical Exam:  Gen: NAD  Left knee: No swelling, bruising, other deformity.  Hypermobile patella. No TTP lateral post patellar facet.  No longer with lat joint line TTP. FROM. Negative ant/post drawers. Negative valgus/varus testing. Negative lachmanns. Negative with mcmurrays, apleys.  Negative patellar apprehension. NV intact distally.  Assessment & Plan:  1. Left knee injury - Much improved from patellar subluxation.  Continue HEP now that finished with formal PT.  Ibuprofen, icing as needed.  F/u prn.

## 2012-11-29 NOTE — Assessment & Plan Note (Signed)
Much improved from patellar subluxation.  Continue HEP now that finished with formal PT.  Ibuprofen, icing as needed.  F/u prn.

## 2014-01-01 ENCOUNTER — Encounter: Payer: Self-pay | Admitting: Family Medicine

## 2014-04-25 ENCOUNTER — Emergency Department (HOSPITAL_BASED_OUTPATIENT_CLINIC_OR_DEPARTMENT_OTHER)
Admission: EM | Admit: 2014-04-25 | Discharge: 2014-04-26 | Disposition: A | Discharge status: Home / Self Care | Payer: BLUE CROSS/BLUE SHIELD | Attending: Emergency Medicine | Admitting: Emergency Medicine

## 2014-04-25 ENCOUNTER — Emergency Department (HOSPITAL_BASED_OUTPATIENT_CLINIC_OR_DEPARTMENT_OTHER): Payer: BLUE CROSS/BLUE SHIELD

## 2014-04-25 ENCOUNTER — Encounter (HOSPITAL_BASED_OUTPATIENT_CLINIC_OR_DEPARTMENT_OTHER): Payer: Self-pay

## 2014-04-25 DIAGNOSIS — E039 Hypothyroidism, unspecified: Secondary | ICD-10-CM | POA: Diagnosis not present

## 2014-04-25 DIAGNOSIS — Z3202 Encounter for pregnancy test, result negative: Secondary | ICD-10-CM | POA: Insufficient documentation

## 2014-04-25 DIAGNOSIS — R0789 Other chest pain: Secondary | ICD-10-CM | POA: Insufficient documentation

## 2014-04-25 DIAGNOSIS — Z720 Tobacco use: Secondary | ICD-10-CM | POA: Diagnosis not present

## 2014-04-25 DIAGNOSIS — Z8719 Personal history of other diseases of the digestive system: Secondary | ICD-10-CM | POA: Insufficient documentation

## 2014-04-25 DIAGNOSIS — R079 Chest pain, unspecified: Secondary | ICD-10-CM | POA: Diagnosis present

## 2014-04-25 LAB — CBC
HCT: 42.5 % (ref 36.0–46.0)
Hemoglobin: 14.3 g/dL (ref 12.0–15.0)
MCH: 31.6 pg (ref 26.0–34.0)
MCHC: 33.6 g/dL (ref 30.0–36.0)
MCV: 93.8 fL (ref 78.0–100.0)
PLATELETS: 254 10*3/uL (ref 150–400)
RBC: 4.53 MIL/uL (ref 3.87–5.11)
RDW: 13.8 % (ref 11.5–15.5)
WBC: 11.5 10*3/uL — AB (ref 4.0–10.5)

## 2014-04-25 LAB — PREGNANCY, URINE: Preg Test, Ur: NEGATIVE

## 2014-04-25 NOTE — ED Notes (Signed)
CP since last Friday-worse this pm with palpitations

## 2014-04-26 LAB — BRAIN NATRIURETIC PEPTIDE: B Natriuretic Peptide: 11.7 pg/mL (ref 0.0–100.0)

## 2014-04-26 LAB — BASIC METABOLIC PANEL
Anion gap: <3 — ABNORMAL LOW (ref 5–15)
BUN: 12 mg/dL (ref 6–23)
CALCIUM: 8.1 mg/dL — AB (ref 8.4–10.5)
CO2: 24 mmol/L (ref 19–32)
Chloride: 110 mmol/L (ref 96–112)
Creatinine, Ser: 0.64 mg/dL (ref 0.50–1.10)
GFR calc Af Amer: >90 mL/min (ref >90)
Glucose, Bld: 98 mg/dL (ref 70–99)
Potassium: 3.4 mmol/L — ABNORMAL LOW (ref 3.5–5.1)
SODIUM: 135 mmol/L (ref 135–145)

## 2014-04-26 LAB — TROPONIN I: Troponin I: <0.03 ng/mL (ref <0.031)

## 2014-04-26 MED ORDER — NITROGLYCERIN 0.4 MG SL SUBL
0.4000 mg | SUBLINGUAL_TABLET | SUBLINGUAL | Status: AC | PRN
  Administered 2014-04-26 (×3): 0.4 mg via SUBLINGUAL
  Filled 2014-04-26: qty 1

## 2014-04-26 MED ORDER — ASPIRIN 81 MG PO CHEW
324.0000 mg | CHEWABLE_TABLET | Freq: Once | ORAL | Status: AC
  Administered 2014-04-26: 324 mg via ORAL
  Filled 2014-04-26: qty 4

## 2014-04-26 NOTE — ED Provider Notes (Signed)
CSN: [id number]     Arrival date & time 04/25/14  2232 History   First MD Initiated Contact with Patient 04/26/14 0144     Chief Complaint  Patient presents with  . Chest Pain     (Consider location/radiation/quality/duration/timing/severity/associated sxs/prior Treatment) HPI  This is a 39 year old female with a 6 day history of intermittent chest pain. Chest pain is located in her left chest and radiates to her neck and her left shoulder. She describes it as a dull pain at rest but becomes a burning pain when she gets active. She has also had intermittent palpitations with it. It is associated with shortness of breath, occasional sweating but no nausea or vomiting. She states her pain became "really bad" late yesterday evening and she decided to come to the ED. Her pain is mild at the present time. Risk factors include smoking and heart disease at age 44 in her father.  Past Medical History  Diagnosis Date  . Lymphadenopathy     subcarinal  . Hypocalcemia     post-op total thyroidectomy  . Liver lesion, right lobe     on MRI 08/16/10 adenoma vs hyperplasia  . Thyroid nodule, toxic or with hyperthyroidism    Past Surgical History  Procedure Laterality Date  . Thyroidectomy    . Appendectomy  1997  . Abdominal hysterectomy  2005    partial  . Tubal ligation  2001   Family History  Problem Relation Age of Onset  . Breast cancer Mother   . Heart disease Father   . Heart attack Father   . Hypertension Father   . Colon cancer Maternal Grandmother   . Hyperlipidemia Neg Hx   . Diabetes Neg Hx   . Sudden death Neg Hx    History  Substance Use Topics  . Smoking status: Current Every Day Smoker -- 0.50 packs/day for 15 years    Types: Cigarettes  . Smokeless tobacco: Never Used  . Alcohol Use: 0.5 oz/week    1 drink(s) per week   OB History    Gravida Para Term Preterm AB TAB SAB Ectopic Multiple Living   4 3   1           Review of Systems  All other systems reviewed  and are negative.   Allergies  Review of patient's allergies indicates no known allergies.  Home Medications   Prior to Admission medications   Medication Sig Start Date End Date Taking? Authorizing Provider  levothyroxine (SYNTHROID) 100 MCG tablet Take 1 tablet (100 mcg total) by mouth daily. 03/23/12   Lanice Shirts, MD   BP 106/70 mmHg  Pulse 86  Temp(Src) 97.9 F (36.6 C) (Oral)  Resp 27  Ht 5\' 1"  (1.549 m)  Wt 175 lb (79.379 kg)  BMI 33.08 kg/m2  SpO2 98%   Physical Exam General: Well-developed, well-nourished female in no acute distress; appearance consistent with age of record HENT: normocephalic; atraumatic Eyes: pupils equal, round and reactive to light; extraocular muscles intact Neck: supple Heart: regular rate and rhythm; no murmurs, rubs or gallops Lungs: clear to auscultation bilaterally Abdomen: soft; nondistended; nontender; no masses or hepatosplenomegaly; bowel sounds present Extremities: No deformity; full range of motion; pulses normal Neurologic: Awake, alert and oriented; motor function intact in all extremities and symmetric; no facial droop Skin: Warm and dry Psychiatric: Normal mood and affect    ED Course  Procedures (including critical care time)   EKG Interpretation   Date/Time:  Wednesday April 25 2014  22:43:48 EST Ventricular Rate:  101 PR Interval:  154 QRS Duration: 80 QT Interval:  352 QTC Calculation: 456 R Axis:   62 Text Interpretation:  Sinus tachycardia Possible Left atrial enlargement  Borderline ECG No significant change was found Confirmed by Wyvonnia Dusky  MD,  STEPHEN [phone]) on 04/25/2014 11:29:45 PM      MDM   Nursing notes and vitals signs, including pulse oximetry, reviewed.  Summary of this visit's results, reviewed by myself:  Labs:  Results for orders placed or performed during the hospital encounter of 04/25/14 (from the past 24 hour(s))  CBC     Status: Abnormal   Collection Time: 04/25/14 11:34 PM   Result Value Ref Range   WBC 11.5 (H) 4.0 - 10.5 K/uL   RBC 4.53 3.87 - 5.11 MIL/uL   Hemoglobin 14.3 12.0 - 15.0 g/dL   HCT 42.5 36.0 - 46.0 %   MCV 93.8 78.0 - 100.0 fL   MCH 31.6 26.0 - 34.0 pg   MCHC 33.6 30.0 - 36.0 g/dL   RDW 13.8 11.5 - 15.5 %   Platelets 254 150 - 400 K/uL  Basic metabolic panel     Status: Abnormal   Collection Time: 04/25/14 11:34 PM  Result Value Ref Range   Sodium 135 135 - 145 mmol/L   Potassium 3.4 (L) 3.5 - 5.1 mmol/L   Chloride 110 96 - 112 mmol/L   CO2 24 19 - 32 mmol/L   Glucose, Bld 98 70 - 99 mg/dL   BUN 12 6 - 23 mg/dL   Creatinine, Ser 0.64 0.50 - 1.10 mg/dL   Calcium 8.1 (L) 8.4 - 10.5 mg/dL   GFR calc non Af Amer >90 >90 mL/min   GFR calc Af Amer >90 >90 mL/min   Anion gap <3 (L) 5 - 15  Troponin I (MHP)     Status: None   Collection Time: 04/25/14 11:34 PM  Result Value Ref Range   Troponin I <0.03 <0.031 ng/mL  Pregnancy,  urine - (pre-menopausal females)     Status: None   Collection Time: 04/25/14 11:37 PM  Result Value Ref Range   Preg Test, Ur NEGATIVE NEGATIVE  Brain natriuretic peptide     Status: None   Collection Time: 04/26/14  1:00 AM  Result Value Ref Range   B Natriuretic Peptide 11.7 0.0 - 100.0 pg/mL    Imaging Studies: Dg Chest 2 View  04/26/2014   CLINICAL DATA:  Chest pain since Friday, worse this evening with palpitations.  EXAM: CHEST  2 VIEW  COMPARISON:  07/03/2010  FINDINGS: The heart size and mediastinal contours are within normal limits. Both lungs are clear. The visualized skeletal structures are unremarkable.  IMPRESSION: No active cardiopulmonary disease.   Electronically Signed   By: Lucienne Capers M.D.   On: 04/26/2014 00:26   2:55 AM Pain-free after sublingual nitroglycerin.  Wynetta Fines, MD 04/26/14 [phone]

## 2014-05-08 ENCOUNTER — Ambulatory Visit (INDEPENDENT_AMBULATORY_CARE_PROVIDER_SITE_OTHER): Payer: BLUE CROSS/BLUE SHIELD | Admitting: Internal Medicine

## 2014-05-08 ENCOUNTER — Encounter: Payer: Self-pay | Admitting: Internal Medicine

## 2014-05-08 VITALS — BP 124/81 | HR 94 | Resp 16 | Ht 61.0 in | Wt 175.0 lb

## 2014-05-08 DIAGNOSIS — R002 Palpitations: Secondary | ICD-10-CM | POA: Diagnosis not present

## 2014-05-08 LAB — LIPID PANEL
CHOLESTEROL: 191 mg/dL (ref 0–200)
HDL: 53 mg/dL (ref >=46)
LDL CALC: 124 mg/dL — AB (ref 0–99)
TRIGLYCERIDES: 69 mg/dL (ref <150)
Total CHOL/HDL Ratio: 3.6 Ratio
VLDL: 14 mg/dL (ref 0–40)

## 2014-05-08 LAB — COMPREHENSIVE METABOLIC PANEL
ALK PHOS: 61 U/L (ref 39–117)
ALT: 27 U/L (ref 0–35)
AST: 17 U/L (ref 0–37)
Albumin: 4.2 g/dL (ref 3.5–5.2)
BILIRUBIN TOTAL: 0.4 mg/dL (ref 0.2–1.2)
BUN: 12 mg/dL (ref 6–23)
CO2: 23 mEq/L (ref 19–32)
CREATININE: 0.59 mg/dL (ref 0.50–1.10)
Calcium: 8.9 mg/dL (ref 8.4–10.5)
Chloride: 107 mEq/L (ref 96–112)
GLUCOSE: 89 mg/dL (ref 70–99)
Potassium: 4.6 mEq/L (ref 3.5–5.3)
SODIUM: 141 meq/L (ref 135–145)
TOTAL PROTEIN: 6.7 g/dL (ref 6.0–8.3)

## 2014-05-08 LAB — CBC WITH DIFFERENTIAL/PLATELET
BASOS ABS: 0 10*3/uL (ref 0.0–0.1)
BASOS PCT: 0 % (ref 0–1)
EOS ABS: 0.1 10*3/uL (ref 0.0–0.7)
Eosinophils Relative: 2 % (ref 0–5)
HCT: 41.1 % (ref 36.0–46.0)
Hemoglobin: 13.8 g/dL (ref 12.0–15.0)
Lymphocytes Relative: 35 % (ref 12–46)
Lymphs Abs: 2.5 10*3/uL (ref 0.7–4.0)
MCH: 31.2 pg (ref 26.0–34.0)
MCHC: 33.6 g/dL (ref 30.0–36.0)
MCV: 93 fL (ref 78.0–100.0)
MONO ABS: 0.4 10*3/uL (ref 0.1–1.0)
MPV: 11.3 fL (ref 8.6–12.4)
Monocytes Relative: 6 % (ref 3–12)
Neutro Abs: 4 10*3/uL (ref 1.7–7.7)
Neutrophils Relative %: 57 % (ref 43–77)
PLATELETS: 259 10*3/uL (ref 150–400)
RBC: 4.42 MIL/uL (ref 3.87–5.11)
RDW: 13.6 % (ref 11.5–15.5)
WBC: 7.1 10*3/uL (ref 4.0–10.5)

## 2014-05-08 LAB — T3, FREE: T3, Free: 2.8 pg/mL (ref 2.3–4.2)

## 2014-05-08 LAB — T4, FREE: Free T4: 0.94 ng/dL (ref 0.80–1.80)

## 2014-05-08 LAB — TSH: TSH: 3.397 u[IU]/mL (ref 0.350–4.500)

## 2014-05-08 NOTE — Progress Notes (Signed)
Subjective:    Patient ID: Kerry Mcpherson, female    DOB: May 16, 1975, 39 y.o.   MRN: [id number]  HPI 04/25/2013 note HPI  This is a 39 year old female with a 6 day history of intermittent chest pain. Chest pain is located in her left chest and radiates to her neck and her left shoulder. She describes it as a dull pain at rest but becomes a burning pain when she gets active. She has also had intermittent palpitations with it. It is associated with shortness of breath, occasional sweating but no nausea or vomiting. She states her pain became "really bad" late yesterday evening and she decided to come to the ED. Her pain is mild at the present time. Risk factors include smoking and heart disease at age 56 in her father.           N         Imaging Studies:  Imaging Results (Last 48 hours)    Dg Chest 2 View  04/26/2014 CLINICAL DATA: Chest pain since Friday, worse this evening with palpitations. EXAM: CHEST 2 VIEW COMPARISON: 07/03/2010 FINDINGS: The heart size and mediastinal contours are within normal limits. Both lungs are clear. The visualized skeletal structures are unremarkable. IMPRESSION: No active cardiopulmonary disease. Electronically Signed By: Lucienne Capers M.D. On: 04/26/2014 00:26    2:55 AM Pain-free after sublingual nitroglycerin.  Karen Chafe Molpus, MD 04/26/14 [phone]       Today  Kerry Mcpherson is here for HFU was admitted to [facility] with chest pain    We have asked for records from [place] but I do not have them as yet. Was seen in ED 2/24 with palpitations and midsternal chest pain radiating to left arm with numbness in arm.    She still smokes but is "trying to quit"   She tells me she had a "chemical stress test " and told it was fine.    She was told her thyroid blood test was "around 7"  She is S/P total thyroidectomy for MNG and is on synthroid 100 mcg daily.  She says she is taking pills regularly   Has had 2-3 episodes of palpitaitons  Lasting a few  minutes to one hour since discharge 10 days ago.   No dizziness, pre-syncope feelings.  She drinks 2-3 cups of coffee per day    No Known Allergies Past Medical History  Diagnosis Date  . Lymphadenopathy     subcarinal  . Hypocalcemia     post-op total thyroidectomy  . Liver lesion, right lobe     on MRI 08/16/10 adenoma vs hyperplasia  . Thyroid nodule, toxic or with hyperthyroidism    Past Surgical History  Procedure Laterality Date  . Thyroidectomy    . Appendectomy  1997  . Abdominal hysterectomy  2005    partial  . Tubal ligation  2001   History   Social History  . Marital Status: Married    Spouse Name: N/A  . Number of Children: 3  . Years of Education: N/A   Occupational History  . Student    Social History Main Topics  . Smoking status: Current Every Day Smoker -- 0.50 packs/day for 15 years    Types: Cigarettes  . Smokeless tobacco: Never Used  . Alcohol Use: 0.5 oz/week    1 drink(s) per week  . Drug Use: No  . Sexual Activity: Yes    Birth Control/ Protection: Surgical   Other Topics Concern  . Not on file  Social History Narrative   Family History  Problem Relation Age of Onset  . Breast cancer Mother   . Heart disease Father   . Heart attack Father   . Hypertension Father   . Colon cancer Maternal Grandmother   . Hyperlipidemia Neg Hx   . Diabetes Neg Hx   . Sudden death Neg Hx    Patient Active Problem List   Diagnosis Date Noted  . Left knee injury 10/21/2012  . Palpitations 05/25/2012  . S/P total thyroidectomy 01/20/2012  . S/P hysterectomy 01/20/2012  . Hypothyroidism 01/20/2012  . Tobacco use 01/20/2012  . Memory disorder 01/20/2012  . Thyroid nodule, toxic or with hyperthyroidism   . Lymphadenopathy   . Hypocalcemia   . Liver lesion, right lobe    Current Outpatient Prescriptions on File Prior to Visit  Medication Sig Dispense Refill  . levothyroxine (SYNTHROID) 100 MCG tablet Take 1 tablet (100 mcg total) by mouth daily.  90 tablet 1   No current facility-administered medications on file prior to visit.       Review of Systems See HPI    Objective:   Physical Exam Physical Exam  Nursing note and vitals reviewed.  Constitutional: She is oriented to person, place, and time. She appears well-developed and well-nourished.  HENT:  Head: Normocephalic and atraumatic.  Cardiovascular: Normal rate and regular rhythm. Exam reveals no gallop and no friction rub.  No murmur heard.  Pulmonary/Chest: Breath sounds normal. She has no wheezes. She has no rales.  Neurological: She is alert and oriented to person, place, and time.  Skin: Skin is warm and dry.  Psychiatric: She has a normal mood and affect. Her behavior is normal.        Assessment & Plan:  Palpitations  Advised to reduce caffiene consumpton.  Will refer to cardiology for Holter.     Hypothyroidism  Will check all labs today Further management pending results  Addendum 12:30  Review of hospital records  Serial troponins neg X3  Nuclear stress  Nl perfusion and wall motion  EF 86%  TSH 7.6  With normal free leves

## 2014-05-09 ENCOUNTER — Telehealth: Payer: Self-pay | Admitting: *Deleted

## 2014-05-09 ENCOUNTER — Encounter: Payer: Self-pay | Admitting: *Deleted

## 2014-05-09 NOTE — Telephone Encounter (Signed)
-----   Message from Lanice Shirts, MD sent at 05/09/2014  7:36 AM EST ----- Call pt and let her know that her repeat TSH and all thyroid blood work is normal.  No change to dose of thyroid med just advise to be sure not to miss any doses and keep new pt appt with cardiologist Ok to mail labs to her

## 2014-05-09 NOTE — Telephone Encounter (Signed)
Patient is aware of lab results -eh

## 2014-05-23 ENCOUNTER — Encounter: Payer: Self-pay | Admitting: Internal Medicine

## 2014-05-23 ENCOUNTER — Ambulatory Visit (INDEPENDENT_AMBULATORY_CARE_PROVIDER_SITE_OTHER): Payer: BLUE CROSS/BLUE SHIELD | Admitting: Internal Medicine

## 2014-05-23 VITALS — BP 122/88 | HR 78 | Ht 61.0 in | Wt 174.0 lb

## 2014-05-23 DIAGNOSIS — R002 Palpitations: Secondary | ICD-10-CM | POA: Diagnosis not present

## 2014-05-23 NOTE — Progress Notes (Signed)
ELECTROPHYSIOLOGY CONSULT NOTE  Patient ID: Kerry Mcpherson, MRN: [id number], DOB/AGE: [birth year] 39 y.o. Admit date: (Not on file) Date of Consult: 05/23/2014  Primary Physician: Kelton Pillar, MD Primary Cardiologist: new  Chief Complaint: sob and tachypalpitations   HPI Kerry Mcpherson is a 39 y.o. female  Referred with a three-week history of chest discomfort associated with radiation to the neck and into the left arm. It is been present for most of the last 3 weeks for many many hours during the day. It is increased with exercise and stress and also increased with lying down. Because of this discomfort she went to [facility]. Notes were reviewed that described a Myoview as discussed demonstrating no ischemia and ejection fraction 78%.   Over the same period of time she is also had problems with dyspnea on exertion which is in the company reproducibly with a heart rates which she recorded in the 120-1 30 range. It feels like a sense of anxiety and he can last minutes to hours. It is accompanied frequently but not always by this chest discomfort. In her mind notably, this was not evident 6 weeks ago at all.  She's taking no trips. She's had no unilateral edema. She does smoke but does not use birth control.  She is also noted shower intolerance and orthostatic intolerance both accompanied by tachypalpitations.  She is eliminating caffeine without improvement. He does not use significant chocolate or other stimulants.  She has a history of syncope that dates back 20+ years. These are stereotypical episodes accompanied by a prodrome characterized by nausea vertigo and a recovery phase of fatigue and headaches. In her mind epiphenomena accompanying her syncope, clearly recognizable, is not dissimilar from the symptoms associated with her current spells.  She is a prior history of palpitations that occur in the context of hyperthyroidism. There is also a description of  chest pain and palpitations 2 years ago for which evaluation was anticipated but was not consummated     Past Medical History  Diagnosis Date  . Lymphadenopathy     subcarinal  . Hypocalcemia     post-op total thyroidectomy  . Liver lesion, right lobe     on MRI 08/16/10 adenoma vs hyperplasia  . Thyroid nodule, toxic or with hyperthyroidism       Surgical History:  Past Surgical History  Procedure Laterality Date  . Thyroidectomy    . Appendectomy  1997  . Abdominal hysterectomy  2005    partial  . Tubal ligation  2001     Home Meds: Prior to Admission medications   Medication Sig Start Date End Date Taking? Authorizing Provider  levothyroxine (SYNTHROID) 100 MCG tablet Take 1 tablet (100 mcg total) by mouth daily. 03/23/12  Yes Lanice Shirts, MD      Allergies: No Known Allergies  History   Social History  . Marital Status: Married    Spouse Name: N/A  . Number of Children: 3  . Years of Education: N/A   Occupational History  . Student    Social History Main Topics  . Smoking status: Current Every Day Smoker -- 0.50 packs/day for 15 years    Types: Cigarettes  . Smokeless tobacco: Never Used  . Alcohol Use: 0.5 oz/week    1 drink(s) per week  . Drug Use: No  . Sexual Activity: Yes    Birth Control/ Protection: Surgical   Other Topics Concern  . Not on file   Social History Narrative  Family History  Problem Relation Age of Onset  . Breast cancer Mother   . Heart disease Father   . Heart attack Father   . Hypertension Father   . Colon cancer Maternal Grandmother   . Hyperlipidemia Neg Hx   . Diabetes Neg Hx   . Sudden death Neg Hx      ROS:  Please see the history of present illness.     All other systems reviewed and negative.    Physical Exam: Blood pressure 122/88, pulse 78, height 5\' 1"  (1.549 m), weight 174 lb (78.926 kg). General: Well developed, well nourished female in no acute distress. Head: Normocephalic, atraumatic,  sclera non-icteric, no xanthomas, nares are without discharge. EENT: normal Lymph Nodes:  none Back: without scoliosis/kyphosis, no CVA tendersness Neck: Negative for carotid bruits. JVD not elevated. Lungs: Clear bilaterally to auscultation without wheezes, rales, or rhonchi. Breathing is unlabored. Heart: RRR with S1 S2. No murmur , rubs, or gallops appreciated. Abdomen: Soft, non-tender, non-distended with normoactive bowel sounds. No hepatomegaly. No rebound/guarding. No obvious abdominal masses. Msk:  Strength and tone appear normal for age. Extremities: No clubbing or cyanosis. No edema.  Distal pedal pulses are 2+ and equal bilaterally. Skin: Warm and Dry Neuro: Alert and oriented X 3. CN III-XII intact Grossly normal sensory and motor function . Psych:  Responds to questions appropriately with a normal affect.      Labs: Cardiac Enzymes No results for input(s): CKTOTAL, CKMB, TROPONINI in the last 72 hours. CBC Lab Results  Component Value Date   WBC 7.1 05/08/2014   HGB 13.8 05/08/2014   HCT 41.1 05/08/2014   MCV 93.0 05/08/2014   PLT 259 05/08/2014   PROTIME: No results for input(s): LABPROT, INR in the last 72 hours. Chemistry No results for input(s): NA, K, CL, CO2, BUN, CREATININE, CALCIUM, PROT, BILITOT, ALKPHOS, ALT, AST, GLUCOSE in the last 168 hours.  Invalid input(s): LABALBU Lipids Lab Results  Component Value Date   CHOL 191 05/08/2014   HDL 53 05/08/2014   LDLCALC 124* 05/08/2014   TRIG 69 05/08/2014   BNP No results found for: PROBNP Thyroid Function Tests: No results for input(s): TSH, T4TOTAL, T3FREE, THYROIDAB in the last 72 hours.  Invalid input(s): FREET3    Miscellaneous Lab Results  Component Value Date   DDIMER  04/03/2010    0.22        AT THE INHOUSE ESTABLISHED CUTOFF VALUE OF 0.48 ug/mL FEU, THIS ASSAY HAS BEEN DOCUMENTED IN THE LITERATURE TO HAVE A SENSITIVITY AND NEGATIVE PREDICTIVE VALUE OF AT LEAST 98 TO 99%.  THE TEST  RESULT SHOULD BE CORRELATED WITH AN ASSESSMENT OF THE CLINICAL PROBABILITY OF DVT / VTE.    Radiology/Studies:  Dg Chest 2 View  04/26/2014   CLINICAL DATA:  Chest pain since Friday, worse this evening with palpitations.  EXAM: CHEST  2 VIEW  COMPARISON:  07/03/2010  FINDINGS: The heart size and mediastinal contours are within normal limits. Both lungs are clear. The visualized skeletal structures are unremarkable.  IMPRESSION: No active cardiopulmonary disease.   Electronically Signed   By: Lucienne Capers M.D.   On: 04/26/2014 00:26    EKG: Sinus rhythm at 78 Intervals 13/09/38 Axis is 60 Inferior Q waves in leads 23 aVF   Assessment and Plan:  Dyspnea on exertion  Tachypalpitations  Neurally mediated syncope  Obesity   Chest pain  She has a complex collection of symptoms which in the context of her prior history of  neurally mediated syncope suggests a dysautonomic process; unfortunately, objective measurements today are not supporting that diagnosis and the abrupt onset of her symptoms 2-3 weeks ago is not also something I would've anticipated  Is intriguing that reflecting back on her long-standing history of syncope, back in her mind these episodes are quite similar to her current events with the accompanying epiphenomena. Shower intolerance and orthostatic intolerance also support a dysautonomic process.  We will undertake treadmill testing to look to see if we can reproduce her symptoms and whether in fact it is associated with a rapidly accelerated heart rate as I anticipate. The fact that she has a negative Myoview scan a couple of weeks ago and records reviewed from her primary care physician makes exceedingly unlikely that there is ischemia here not withstanding her blood pressure issues.        Virl Axe

## 2014-05-23 NOTE — Patient Instructions (Signed)
Your physician recommends that you continue on your current medications as directed. Please refer to the Current Medication list given to you today.  Your physician has requested that you have an echocardiogram. Echocardiography is a painless test that uses sound waves to create images of your heart. It provides your doctor with information about the size and shape of your heart and how well your heart's chambers and valves are working. This procedure takes approximately one hour. There are no restrictions for this procedure.  Your physician has requested that you have an exercise tolerance test. For further information please visit [email or web address]. Please also follow instruction sheet, as given.  Please try and schedule this on a day that Dr. Caryl Comes is in the office.

## 2014-05-24 ENCOUNTER — Other Ambulatory Visit: Payer: Self-pay

## 2014-05-24 ENCOUNTER — Ambulatory Visit (HOSPITAL_COMMUNITY): Payer: BLUE CROSS/BLUE SHIELD | Attending: Cardiology | Admitting: Radiology

## 2014-05-24 DIAGNOSIS — R002 Palpitations: Secondary | ICD-10-CM | POA: Insufficient documentation

## 2014-05-24 DIAGNOSIS — Z87891 Personal history of nicotine dependence: Secondary | ICD-10-CM | POA: Insufficient documentation

## 2014-05-24 NOTE — Progress Notes (Signed)
Echocardiogram performed.  

## 2014-05-31 ENCOUNTER — Ambulatory Visit (INDEPENDENT_AMBULATORY_CARE_PROVIDER_SITE_OTHER): Payer: BLUE CROSS/BLUE SHIELD | Admitting: Internal Medicine

## 2014-05-31 DIAGNOSIS — R002 Palpitations: Secondary | ICD-10-CM | POA: Diagnosis not present

## 2014-05-31 NOTE — Patient Instructions (Signed)
You have been given 2 different prescriptions to try. You may take them in any order. DO NOT take these medications at the same time.  Metoprolol Succinate 25 mg daily Metoprolol Tartrate 25 mg twice daily  Your physician recommends that you schedule a follow-up appointment in: 8 weeks with Dr. Caryl Comes

## 2014-05-31 NOTE — Progress Notes (Signed)
Exercise Treadmill Test  Pre-Exercise Testing Evaluation Rhythm: normal sinus  Rate: 84 bpm     Test  Exercise Tolerance Test Ordering MD: Virl Axe, MD  Interpreting MD: Virl Axe, MD  Unique Test No: 1  Treadmill:  1  Indication for ETT: Palps  Contraindication to ETT: No   Stress Modality: exercise - treadmill  Cardiac Imaging Performed: non   Protocol: standard Bruce - maximal  Max BP:  155/80  Max MPHR (bpm):  182 85% MPR (bpm):  155  MPHR obtained (bpm):  181 % MPHR obtained:    Reached 85% MPHR (min:sec):   Total Exercise Time (min-sec):    Workload in METS:   Borg Scale:   Reason ETT Terminated:      ST Segment Analysis At Rest:  With Exercise:   Other Information Arrhythmia:   Angina during ETT:   Quality of ETT:    ETT Interpretation:  Very rapid HR with minimal exertion HR 78--104 w standng  Reasonable rapid recovert  Comments: Overzealous HR response tochange in position and minimal exercise consistent with hypothesis of autonomic dysfunciton  Recommendations: Low dose beta blockers Metoprolol succinate and tartrate 25 mg

## 2014-06-01 ENCOUNTER — Telehealth: Payer: Self-pay | Admitting: Internal Medicine

## 2014-06-01 ENCOUNTER — Encounter (HOSPITAL_COMMUNITY): Payer: BLUE CROSS/BLUE SHIELD

## 2014-06-01 NOTE — Telephone Encounter (Signed)
Left message for the pt to call back to inform her that per Dr Caryl Comes, she had a normal echo.

## 2014-06-01 NOTE — Telephone Encounter (Signed)
Notified of echo results.

## 2014-06-01 NOTE — Telephone Encounter (Signed)
New message     Patient returning call from nurse on yesterday.

## 2014-06-01 NOTE — Telephone Encounter (Signed)
LMTCB 2nd attempt

## 2014-06-01 NOTE — Telephone Encounter (Signed)
Follow up ° °Pt returned call  °

## 2014-07-26 ENCOUNTER — Ambulatory Visit: Payer: BLUE CROSS/BLUE SHIELD | Admitting: Internal Medicine

## 2014-09-07 ENCOUNTER — Encounter: Payer: BLUE CROSS/BLUE SHIELD | Admitting: Internal Medicine

## 2014-09-07 ENCOUNTER — Encounter: Payer: Self-pay | Admitting: Internal Medicine

## 2014-09-10 ENCOUNTER — Telehealth: Payer: Self-pay | Admitting: Internal Medicine

## 2014-09-10 ENCOUNTER — Encounter: Payer: Self-pay | Admitting: Internal Medicine

## 2015-01-15 ENCOUNTER — Other Ambulatory Visit: Payer: Self-pay | Admitting: Geriatric Medicine

## 2015-01-15 ENCOUNTER — Other Ambulatory Visit: Payer: Self-pay | Admitting: Internal Medicine

## 2015-01-15 DIAGNOSIS — R1013 Epigastric pain: Secondary | ICD-10-CM

## 2015-01-17 ENCOUNTER — Ambulatory Visit
Admission: RE | Admit: 2015-01-17 | Discharge: 2015-01-17 | Disposition: A | Discharge status: Home / Self Care | Payer: BLUE CROSS/BLUE SHIELD | Source: Ambulatory Visit | Attending: Geriatric Medicine | Admitting: Geriatric Medicine

## 2015-01-17 DIAGNOSIS — R1013 Epigastric pain: Secondary | ICD-10-CM

## 2015-01-21 ENCOUNTER — Other Ambulatory Visit: Payer: Self-pay | Admitting: Internal Medicine

## 2015-01-21 DIAGNOSIS — J984 Other disorders of lung: Secondary | ICD-10-CM

## 2015-01-22 ENCOUNTER — Other Ambulatory Visit: Payer: Self-pay | Admitting: Internal Medicine

## 2015-01-22 DIAGNOSIS — Z1231 Encounter for screening mammogram for malignant neoplasm of breast: Secondary | ICD-10-CM

## 2015-01-22 DIAGNOSIS — Z803 Family history of malignant neoplasm of breast: Secondary | ICD-10-CM

## 2015-02-01 ENCOUNTER — Ambulatory Visit
Admission: RE | Admit: 2015-02-01 | Discharge: 2015-02-01 | Disposition: A | Discharge status: Home / Self Care | Payer: BLUE CROSS/BLUE SHIELD | Source: Ambulatory Visit | Attending: Internal Medicine | Admitting: Internal Medicine

## 2015-02-01 DIAGNOSIS — J984 Other disorders of lung: Secondary | ICD-10-CM

## 2015-02-01 MED ORDER — IOPAMIDOL (ISOVUE-300) INJECTION 61%
75.0000 mL | Freq: Once | INTRAVENOUS | Status: AC | PRN
  Administered 2015-02-01: 75 mL via INTRAVENOUS

## 2015-02-08 ENCOUNTER — Ambulatory Visit: Payer: BLUE CROSS/BLUE SHIELD | Admitting: Family

## 2015-02-14 ENCOUNTER — Ambulatory Visit
Admission: RE | Admit: 2015-02-14 | Discharge: 2015-02-14 | Disposition: A | Discharge status: Home / Self Care | Payer: BLUE CROSS/BLUE SHIELD | Source: Ambulatory Visit | Attending: Internal Medicine | Admitting: Internal Medicine

## 2015-02-14 DIAGNOSIS — Z803 Family history of malignant neoplasm of breast: Secondary | ICD-10-CM

## 2015-02-14 DIAGNOSIS — Z1231 Encounter for screening mammogram for malignant neoplasm of breast: Secondary | ICD-10-CM

## 2015-07-08 ENCOUNTER — Encounter (HOSPITAL_BASED_OUTPATIENT_CLINIC_OR_DEPARTMENT_OTHER): Payer: Self-pay | Admitting: *Deleted

## 2015-07-08 ENCOUNTER — Emergency Department (HOSPITAL_BASED_OUTPATIENT_CLINIC_OR_DEPARTMENT_OTHER): Payer: BLUE CROSS/BLUE SHIELD

## 2015-07-08 ENCOUNTER — Emergency Department (HOSPITAL_BASED_OUTPATIENT_CLINIC_OR_DEPARTMENT_OTHER)
Admission: EM | Admit: 2015-07-08 | Discharge: 2015-07-08 | Disposition: A | Discharge status: Home / Self Care | Payer: BLUE CROSS/BLUE SHIELD | Attending: Emergency Medicine | Admitting: Emergency Medicine

## 2015-07-08 DIAGNOSIS — Y999 Unspecified external cause status: Secondary | ICD-10-CM | POA: Insufficient documentation

## 2015-07-08 DIAGNOSIS — Y939 Activity, unspecified: Secondary | ICD-10-CM | POA: Diagnosis not present

## 2015-07-08 DIAGNOSIS — Y929 Unspecified place or not applicable: Secondary | ICD-10-CM | POA: Diagnosis not present

## 2015-07-08 DIAGNOSIS — S9032XA Contusion of left foot, initial encounter: Secondary | ICD-10-CM | POA: Diagnosis not present

## 2015-07-08 DIAGNOSIS — F17210 Nicotine dependence, cigarettes, uncomplicated: Secondary | ICD-10-CM | POA: Diagnosis not present

## 2015-07-08 DIAGNOSIS — W1830XA Fall on same level, unspecified, initial encounter: Secondary | ICD-10-CM | POA: Insufficient documentation

## 2015-07-08 DIAGNOSIS — M79672 Pain in left foot: Secondary | ICD-10-CM | POA: Diagnosis present

## 2015-07-08 MED ORDER — HYDROCODONE-ACETAMINOPHEN 5-325 MG PO TABS: ORAL_TABLET | ORAL | Status: AC

## 2015-07-08 MED ORDER — HYDROCODONE-ACETAMINOPHEN 5-325 MG PO TABS
1.0000 | ORAL_TABLET | Freq: Once | ORAL | Status: AC
  Administered 2015-07-08: 1 via ORAL
  Filled 2015-07-08: qty 1

## 2015-07-08 MED FILL — HYDROCODON-APAP 5-325: 5-325 | 2 days supply | Qty: 7 | Fill #0

## 2015-07-08 NOTE — ED Provider Notes (Signed)
CSN: [id number]     Arrival date & time 07/08/15  1554 History   First MD Initiated Contact with Patient 07/08/15 1606     Chief Complaint  Patient presents with  . Foot Injury     (Consider location/radiation/quality/duration/timing/severity/associated sxs/prior Treatment) HPI  Blood pressure 131/92, pulse 95, temperature 98.5 F (36.9 C), temperature source Oral, resp. rate 18, height 5\' 1"  (1.549 m), weight 81.647 kg, SpO2 98 %.  Kerry Mcpherson is a 40 y.o. female complaining of 7 out of 10 pain to left lateral foot after falling earlier this morning. Patient states she stood up after sitting on the couch, she realized that she was sitting on her foot in a way that made the legs fall asleep, she immediately fell, there was no other trauma. She took some ibuprofen at home with little no prior trauma or surgeries to the affected joint, has seen Shane Hudnall in the past.relief. She is ambulatory but with increasing pain  Past Medical History  Diagnosis Date  . Lymphadenopathy     subcarinal  . Hypocalcemia     post-op total thyroidectomy  . Liver lesion, right lobe     on MRI 08/16/10 adenoma vs hyperplasia  . Thyroid nodule, toxic or with hyperthyroidism    Past Surgical History  Procedure Laterality Date  . Thyroidectomy    . Appendectomy  1997  . Abdominal hysterectomy  2005    partial  . Tubal ligation  2001   Family History  Problem Relation Age of Onset  . Breast cancer Mother   . Heart disease Father   . Heart attack Father   . Hypertension Father   . Colon cancer Maternal Grandmother   . Hyperlipidemia Neg Hx   . Diabetes Neg Hx   . Sudden death Neg Hx    Social History  Substance Use Topics  . Smoking status: Current Every Day Smoker -- 0.50 packs/day for 15 years    Types: Cigarettes  . Smokeless tobacco: Never Used  . Alcohol Use: 0.5 oz/week    1 drink(s) per week   OB History    Gravida Para Term Preterm AB TAB SAB Ectopic Multiple Living   4 3   1            Review of Systems  10 systems reviewed and found to be negative, except as noted in the HPI.   Allergies  Review of patient's allergies indicates no known allergies.  Home Medications   Prior to Admission medications   Medication Sig Start Date End Date Taking? Authorizing Provider  HYDROcodone-acetaminophen (NORCO/VICODIN) 5-325 MG tablet Take 1-2 tablets by mouth every 6 hours as needed for pain and/or cough. 07/08/15   Rohnan Bartleson, PA-C  levothyroxine (SYNTHROID) 100 MCG tablet Take 1 tablet (100 mcg total) by mouth daily. 03/23/12   Lanice Shirts, MD   BP 131/92 mmHg  Pulse 95  Temp(Src) 98.5 F (36.9 C) (Oral)  Resp 18  Ht 5\' 1"  (1.549 m)  Wt 81.647 kg  BMI 34.03 kg/m2  SpO2 98% Physical Exam  Constitutional: She is oriented to person, place, and time. She appears well-developed and well-nourished. No distress.  HENT:  Head: Normocephalic and atraumatic.  Mouth/Throat: Oropharynx is clear and moist.  Eyes: Conjunctivae and EOM are normal. Pupils are equal, round, and reactive to light.  Neck: Normal range of motion.  Cardiovascular: Normal rate, regular rhythm and intact distal pulses.   Pulmonary/Chest: Effort normal and breath sounds normal. No stridor. No respiratory  distress. She has no rales. She exhibits no tenderness.  Abdominal: Soft. Bowel sounds are normal. She exhibits no distension and no mass. There is tenderness. There is no rebound and no guarding.  Ecchymoses to left lateral foot DP and PT pulses are 2+, no tenderness palpation of the bilateral malleoli.  Musculoskeletal: Normal range of motion.  Neurological: She is alert and oriented to person, place, and time.  Psychiatric: She has a normal mood and affect.  Nursing note and vitals reviewed.   ED Course  Procedures (including critical care time) Labs Review Labs Reviewed - No data to display  Imaging Review Dg Foot Complete Left  07/08/2015  CLINICAL DATA:  Fall.  Bruising 2  lateral side of left foot. EXAM: LEFT FOOT - COMPLETE 3+ VIEW COMPARISON:  None. FINDINGS: There is no evidence of fracture or dislocation. There is no evidence of arthropathy or other focal bone abnormality. Soft tissues are unremarkable. IMPRESSION: Negative. Electronically Signed   By: Kerby Moors M.D.   On: 07/08/2015 16:26   I have personally reviewed and evaluated these images and lab results as part of my medical decision-making.   EKG Interpretation None      MDM   Final diagnoses:  Foot contusion, left, initial encounter   Filed Vitals:   07/08/15 1601  BP: 131/92  Pulse: 95  Temp: 98.5 F (36.9 C)  TempSrc: Oral  Resp: 18  Height: 5\' 1"  (1.549 m)  Weight: 81.647 kg  SpO2: 98%    Medications  HYDROcodone-acetaminophen (NORCO/VICODIN) 5-325 MG per tablet 1 tablet (1 tablet Oral Given 07/08/15 1626)    Kerry Mcpherson is 40 y.o. female presenting with left foot pain and Contusion. Neurovascularly intact, x-ray negative, patient is ambulatory. We'll give her crutches, recommend rest, ice, compression elevation.  Evaluation does not show pathology that would require ongoing emergent intervention or inpatient treatment. Pt is hemodynamically stable and mentating appropriately. Discussed findings and plan with patient/guardian, who agrees with care plan. All questions answered. Return precautions discussed and outpatient follow up given.   New Prescriptions   HYDROCODONE-ACETAMINOPHEN (NORCO/VICODIN) 5-325 MG TABLET    Take 1-2 tablets by mouth every 6 hours as needed for pain and/or cough.        Monico Blitz, PA-C 07/08/15 1636  Veryl Speak, MD 07/08/15 [phone]

## 2015-07-08 NOTE — ED Notes (Signed)
Pt states she fell at 1100 am this morning. Stood up when left foot was asleep and it buckled on her. Bruising to lateral side of left foot with slight swelling. 2+ pedals pulses.

## 2015-07-08 NOTE — Discharge Instructions (Signed)
Rest, Ice intermittently (in the first 24-48 hours), Gentle compression with an Ace wrap, and elevate (Limb above the level of the heart) °  °Take up to 800mg of ibuprofen (that is usually 4 over the counter pills)  3 times a day for 5 days. Take with food. ° °Take vicodin for breakthrough pain, do not drink alcohol, drive, care for children or do other critical tasks while taking vicodin. ° °Please follow with your primary care doctor in the next 2 days for a check-up. They must obtain records for further management.  ° °Do not hesitate to return to the Emergency Department for any new, worsening or concerning symptoms.  ° ° °

## 2015-11-28 NOTE — Progress Notes (Signed)
This encounter was created in error - please disregard.

## 2016-02-28 ENCOUNTER — Telehealth: Payer: BLUE CROSS/BLUE SHIELD | Admitting: Family

## 2016-02-28 DIAGNOSIS — N390 Urinary tract infection, site not specified: Secondary | ICD-10-CM

## 2016-02-28 DIAGNOSIS — A499 Bacterial infection, unspecified: Secondary | ICD-10-CM

## 2016-02-28 MED ORDER — NITROFURANTOIN MONOHYD MACRO 100 MG PO CAPS: 100.0000 mg | ORAL_CAPSULE | Freq: Two times a day (BID) | ORAL | 0 refills | Status: AC

## 2016-02-28 NOTE — Progress Notes (Signed)

## 2016-03-03 IMAGING — CT CT CHEST W/ CM
2 of 3 series · 15 of 30 positions shown, 17 images · IV contrast (75CC ISOVUE 300)
Comparison: 08/13/2010, 04/07/2010

CLINICAL DATA: Left chest pain, history of right lower lobe pleural
density.

EXAM:
CT CHEST WITH CONTRAST
TECHNIQUE: Multidetector CT imaging of the chest was performed during
intravenous contrast administration.
CONTRAST:  75mL 49PNIO-QYY IOPAMIDOL (49PNIO-QYY) INJECTION 61%

[Series 3: chest with · axial · 0.66mm/px · z∈[-211,+19]mm · 7 of 62 slices shown, 9 images]
[im 8/62  mediastinal]
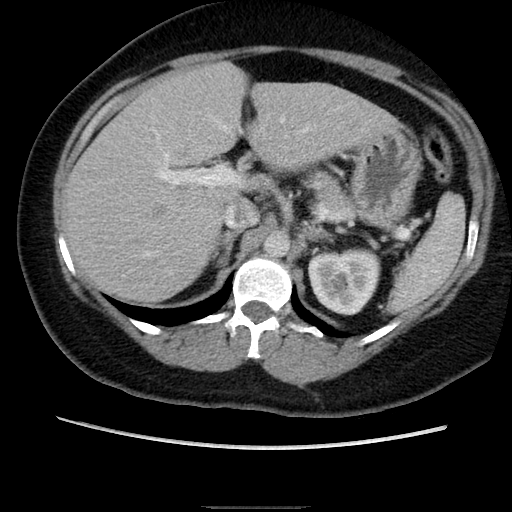
[im 8/62  lung]
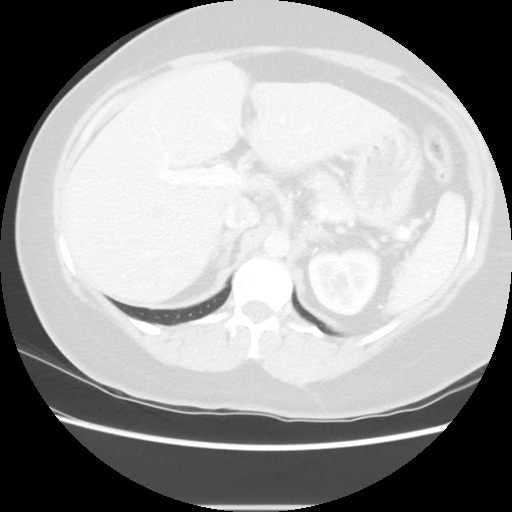
[im 16/62  lung]
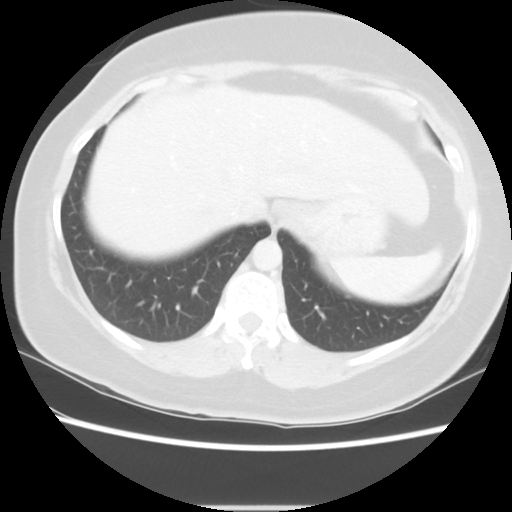
[im 23/62  lung]
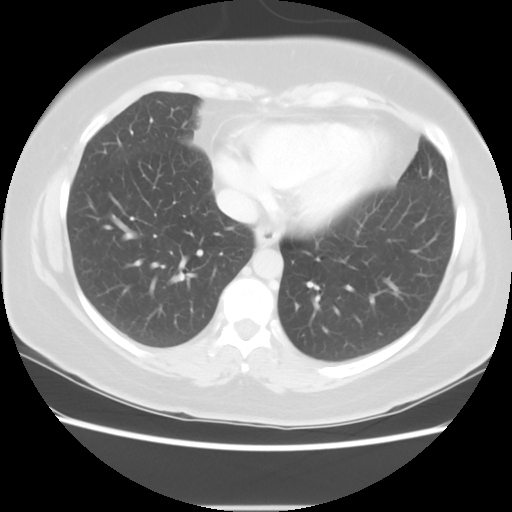
[im 31/62  lung]
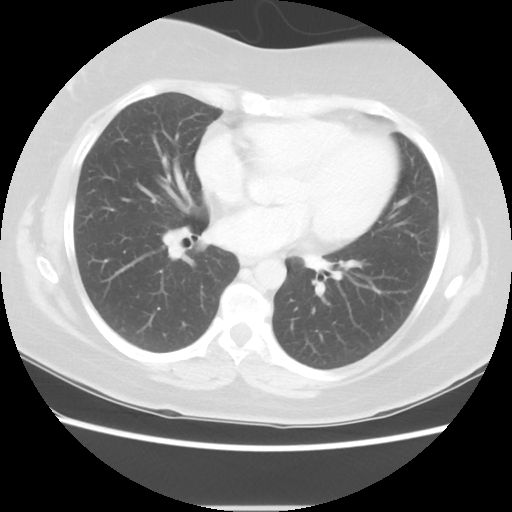
[im 39/62  mediastinal]
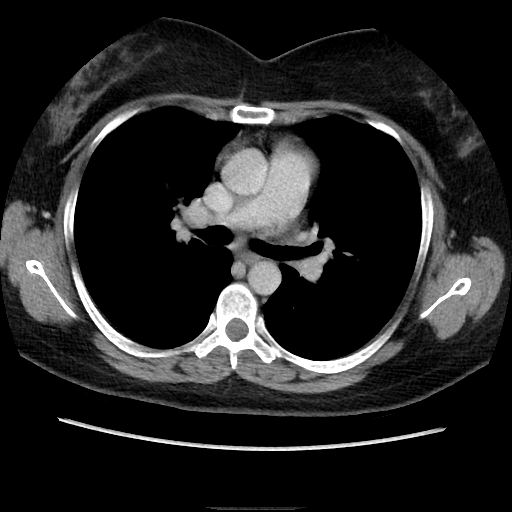
[im 39/62  lung]
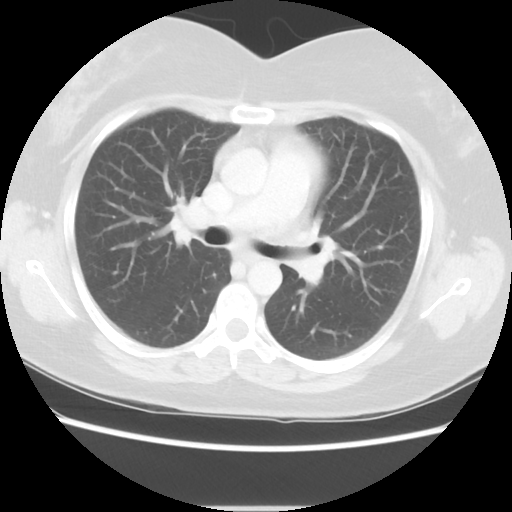
[im 46/62  lung]
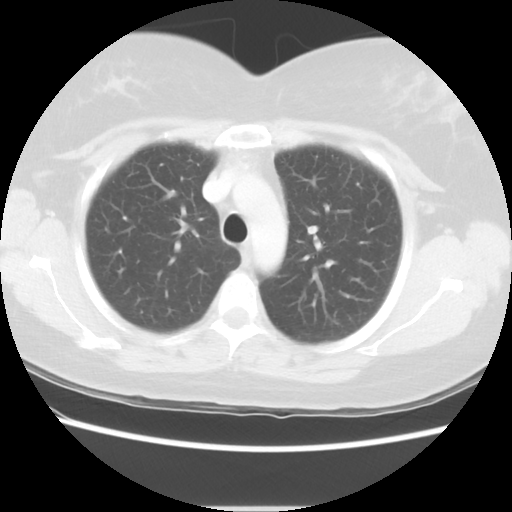
[im 54/62  lung]
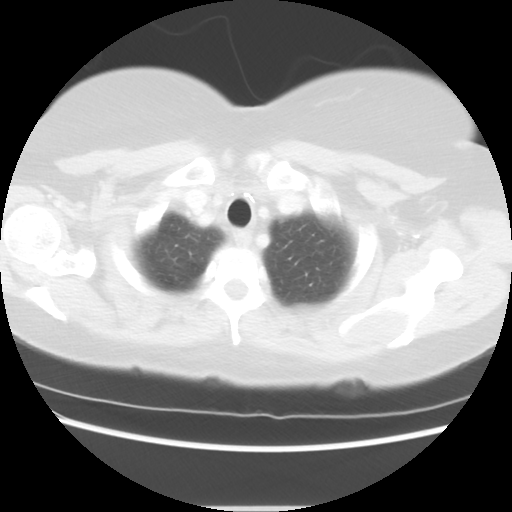

[Series 602: sagittal body · sagittal · 0.66mm/px · 8 of 137 slices shown]
[im 16/137  mediastinal]
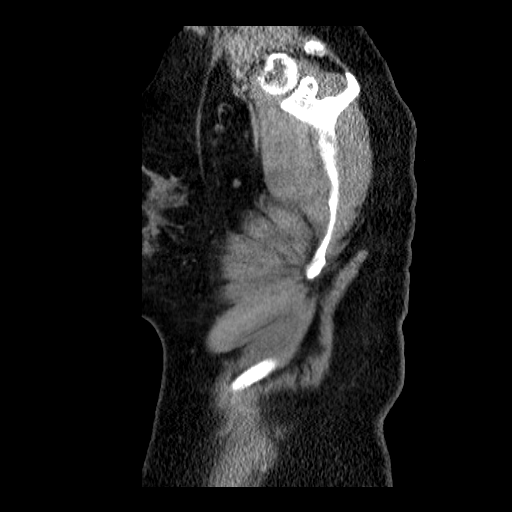
[im 31/137  mediastinal]
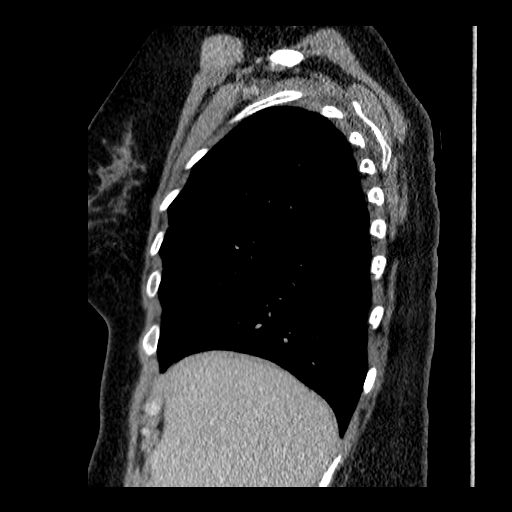
[im 46/137  mediastinal]
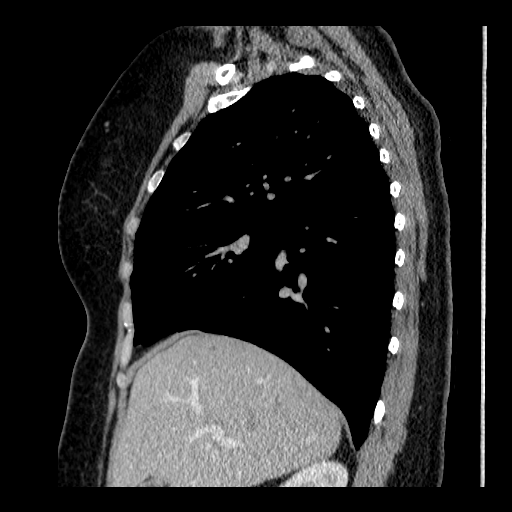
[im 61/137  mediastinal]
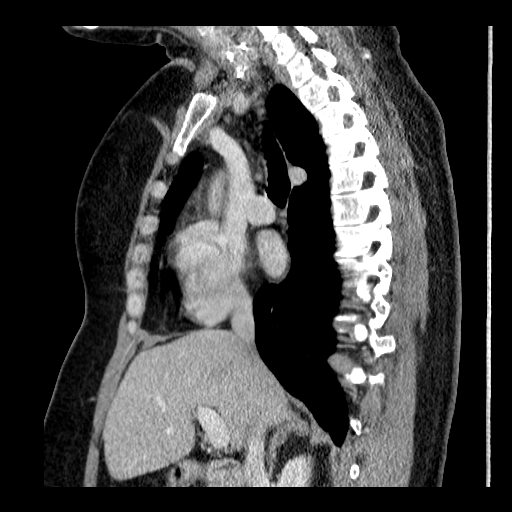
[im 76/137  mediastinal]
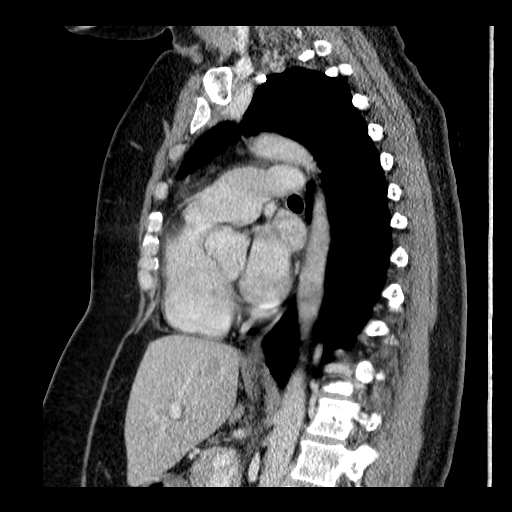
[im 91/137  mediastinal]
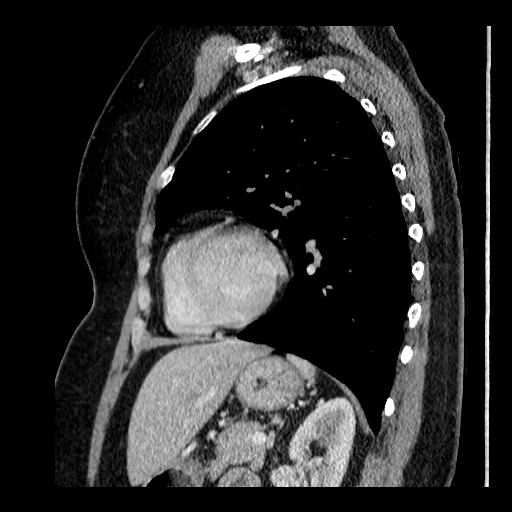
[im 106/137  mediastinal]
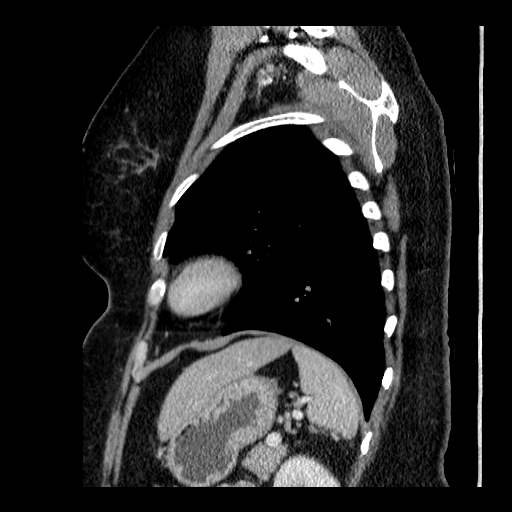
[im 121/137  mediastinal]
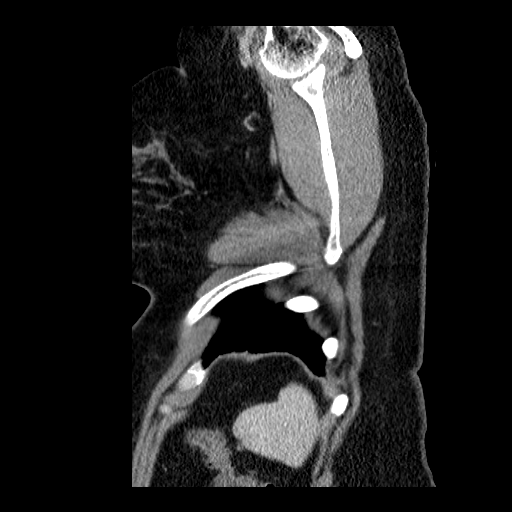

[15 of 30 positions shown; findings below may reference images not displayed]

FINDINGS: Mediastinum/Lymph Nodes: Prior thyroidectomy noted. No
supraclavicular, axillary, mediastinal, or hilar adenopathy. Normal
heart size. Intact aorta. No aneurysm. No pericardial or pleural
effusion.

Lungs/Pleura: Lungs are clear. No focal pneumonia, collapse or
consolidation. No interstitial process or edema. Trachea and central
airways are patent. Negative for pneumothorax. Negative for
pulmonary nodule or mass. Previously described punctate right lower
lobe pleural density has resolved, compatible with atelectasis.

Upper abdomen: No acute findings.

Musculoskeletal: No acute or abnormal osseous finding. No chest wall
soft tissue abnormality or asymmetry. Intact sternum.
IMPRESSION: Prior thyroidectomy.

No acute intra thoracic process or adenopathy.

No suspicious pulmonary nodule or mass. Previously described
punctate right lower lobe pleural density has resolved.

## 2017-03-25 ENCOUNTER — Telehealth: Payer: Self-pay | Admitting: General Practice

## 2017-03-25 NOTE — Telephone Encounter (Signed)
Called pt about her request to establish new care with our practice. LVM for pt to call back and and schedule a NP appt.

## 2018-03-25 ENCOUNTER — Other Ambulatory Visit (HOSPITAL_BASED_OUTPATIENT_CLINIC_OR_DEPARTMENT_OTHER): Payer: Self-pay | Admitting: Internal Medicine

## 2018-03-25 ENCOUNTER — Ambulatory Visit (HOSPITAL_BASED_OUTPATIENT_CLINIC_OR_DEPARTMENT_OTHER)
Admission: RE | Admit: 2018-03-25 | Discharge: 2018-03-25 | Disposition: A | Discharge status: Home / Self Care | Payer: BLUE CROSS/BLUE SHIELD | Source: Ambulatory Visit | Attending: Internal Medicine | Admitting: Internal Medicine

## 2018-03-25 ENCOUNTER — Encounter (HOSPITAL_BASED_OUTPATIENT_CLINIC_OR_DEPARTMENT_OTHER): Payer: Self-pay

## 2018-03-25 DIAGNOSIS — Z1231 Encounter for screening mammogram for malignant neoplasm of breast: Secondary | ICD-10-CM | POA: Diagnosis not present

## 2018-03-28 ENCOUNTER — Other Ambulatory Visit: Payer: Self-pay | Admitting: Internal Medicine

## 2018-03-28 DIAGNOSIS — R928 Other abnormal and inconclusive findings on diagnostic imaging of breast: Secondary | ICD-10-CM

## 2018-03-30 ENCOUNTER — Other Ambulatory Visit: Payer: Self-pay | Admitting: Family Medicine

## 2018-03-31 ENCOUNTER — Ambulatory Visit
Admission: RE | Admit: 2018-03-31 | Discharge: 2018-03-31 | Disposition: A | Discharge status: Home / Self Care | Payer: BLUE CROSS/BLUE SHIELD | Source: Ambulatory Visit | Attending: Internal Medicine | Admitting: Internal Medicine

## 2018-03-31 ENCOUNTER — Other Ambulatory Visit: Payer: Self-pay | Admitting: Internal Medicine

## 2018-03-31 DIAGNOSIS — R928 Other abnormal and inconclusive findings on diagnostic imaging of breast: Secondary | ICD-10-CM

## 2018-03-31 DIAGNOSIS — N63 Unspecified lump in unspecified breast: Secondary | ICD-10-CM

## 2018-04-18 ENCOUNTER — Ambulatory Visit: Payer: BLUE CROSS/BLUE SHIELD | Admitting: Medical

## 2018-04-21 ENCOUNTER — Encounter: Payer: Self-pay | Admitting: Genetic Counselor

## 2018-04-21 ENCOUNTER — Telehealth: Payer: Self-pay | Admitting: Genetic Counselor

## 2018-04-21 NOTE — Telephone Encounter (Signed)
A genetic counseling appt has been scheduled for the pt to see Leda Quail on 3/12 at 845am. Letter mailed to the pt.

## 2018-05-12 ENCOUNTER — Inpatient Hospital Stay: Payer: BLUE CROSS/BLUE SHIELD | Attending: Genetic Counselor | Admitting: Genetic Counselor

## 2018-05-12 ENCOUNTER — Inpatient Hospital Stay: Payer: BLUE CROSS/BLUE SHIELD

## 2018-05-12 ENCOUNTER — Other Ambulatory Visit: Payer: Self-pay

## 2018-05-12 ENCOUNTER — Encounter: Payer: Self-pay | Admitting: Genetic Counselor

## 2018-05-12 DIAGNOSIS — Z803 Family history of malignant neoplasm of breast: Secondary | ICD-10-CM | POA: Insufficient documentation

## 2018-05-12 DIAGNOSIS — Z7289 Other problems related to lifestyle: Secondary | ICD-10-CM

## 2018-05-12 DIAGNOSIS — Z807 Family history of other malignant neoplasms of lymphoid, hematopoietic and related tissues: Secondary | ICD-10-CM

## 2018-05-12 DIAGNOSIS — Z800 Family history of malignant neoplasm of digestive organs: Secondary | ICD-10-CM

## 2018-05-12 DIAGNOSIS — Z8052 Family history of malignant neoplasm of bladder: Secondary | ICD-10-CM

## 2018-05-12 DIAGNOSIS — F17210 Nicotine dependence, cigarettes, uncomplicated: Secondary | ICD-10-CM

## 2018-05-12 NOTE — Progress Notes (Signed)
[facility]      Initial Visit   Patient Name: Kerry Mcpherson Patient DOB: [birth year] Patient Age: 43 y.o. Encounter Date: 05/12/2018  Referring Provider: Lanice Shirts, MD  Primary Care Provider: Lanice Shirts, MD  Reason for Visit: Evaluate for hereditary susceptibility to cancer    Assessment and Plan:  . Kerry Mcpherson's family history is not highly suggestive of a hereditary predisposition to cancer, but testing is warranted given the breast cancer in her mother at a young age and in her paternal grandmother. Testing her will address both sides of her family history.   . Payment options, including insurance billing versus self-pay ($250), were discussed. Kerry Mcpherson decided to proceed with the insurance billing option for this testing. She is aware that all billing for this testing is handled directly by the genetic testing laboratory and not through [facility].   . Testing is recommended to determine whether she has a pathogenic mutation that will impact her screening and risk-reduction for cancer. A negative result will be reassuring.  . Kerry Mcpherson wished to pursue genetic testing and a blood sample will be sent for analysis of the 84 genes on [facility]'s Multi-Cancer panel (AIP, ALK, APC, ATM, AXIN2, BAP1, BARD1, BLM, BMPR1A, BRCA1, BRCA2, BRIP1, CASR, CDC73, CDH1, CDK4, CDKN1B, CDKN1C, CDKN2A, CEBPA, CHEK2, CTNNA1, DICER1, DIS3L2, EGFR, EPCAM, FH, FLCN, GATA2, GPC3, GREM1, HOXB13, HRAS, KIT, MAX, MEN1, MET, MITF, MLH1, MSH2, MSH3, MSH6, MUTYH, NBN, NF1, NF2, NTHL1, PALB2, PDGFRA, PHOX2B, PMS2, POLD1, POLE, POT1, PRKAR1A, PTCH1, PTEN, RAD50, RAD51C, RAD51D, RB1, RECQL4, RET, RUNX1, SDHA, SDHAF2, SDHB, SDHC, SDHD, SMAD4, SMARCA4, SMARCB1, SMARCE1, STK11, SUFU, TERC, TERT, TMEM127, TP53, TSC1, TSC2, VHL, WRN, WT1).   . Results should be available in approximately 2-3 weeks, at which point I will contact her and address implications for her as well  as address genetic testing for at-risk family members, if needed.     Kerry Mcpherson questions were answered to her satisfaction today and she is welcome to call with any additional questions or concerns. Thank you for the referral and allowing me to share in the care of your patient.    Dr. Jana Hakim was available for questions concerning this case. Total time spent by me in face-to-face counseling was approximately 30 minutes.   _____________________________________________________________________   History of Present Illness: Kerry Mcpherson, a 43 y.o. female, is being seen at the [facility] due to a family history of cancer. She presents to clinic today to discuss the possibility of a hereditary predisposition to cancer and discuss whether genetic testing is warranted.  Kerry Mcpherson has no personal history of cancer. She currently undergoes a yearly mammogram, clinical breast exam and gynecologic exam. She reportedly had a hysterectomy (ovaries intact) at age 1 due to endometriosis.  Past Medical History:  Diagnosis Date  . Family history of breast cancer   . Hypocalcemia    post-op total thyroidectomy  . Liver lesion, right lobe    on MRI 08/16/10 adenoma vs hyperplasia  . Lymphadenopathy    subcarinal  . Thyroid nodule, toxic or with hyperthyroidism     Past Surgical History:  Procedure Laterality Date  . ABDOMINAL HYSTERECTOMY  2005   partial  . APPENDECTOMY  1997  . THYROIDECTOMY    . TUBAL LIGATION  2001    Social History   Socioeconomic History  . Marital status: Married    Spouse name: Not on file  .  Number of children: 3  . Years of education: Not on file  . Highest education level: Not on file  Occupational History  . Occupation: Lexicographer: STUDENT  Social Needs  . Financial resource strain: Not on file  . Food insecurity:    Worry: Not on file    Inability: Not on file  . Transportation needs:    Medical: Not on file     Non-medical: Not on file  Tobacco Use  . Smoking status: Current Every Day Smoker    Packs/day: 0.50    Years: 15.00    Pack years: 7.50    Types: Cigarettes  . Smokeless tobacco: Never Used  Substance and Sexual Activity  . Alcohol use: Yes    Alcohol/week: 1.0 standard drinks    Types: 1 Standard drinks or equivalent per week  . Drug use: No  . Sexual activity: Yes    Birth control/protection: Surgical  Lifestyle  . Physical activity:    Days per week: Not on file    Minutes per session: Not on file  . Stress: Not on file  Relationships  . Social connections:    Talks on phone: Not on file    Gets together: Not on file    Attends religious service: Not on file    Active member of club or organization: Not on file    Attends meetings of clubs or organizations: Not on file    Relationship status: Not on file  Other Topics Concern  . Not on file  Social History Narrative  . Not on file     Family History:  During the visit, a 4-generation pedigree was obtained. Family tree will be scanned in the Media tab in Epic  Significant diagnoses include the following:  Family History  Problem Relation Age of Onset  . Breast cancer Mother 27       currently 60  . Heart disease Father   . Heart attack Father   . Hypertension Father   . Colon cancer Maternal Grandmother 55       deceased 92  . Cancer Maternal Uncle        bladder; deceased 55; smoker  . Breast cancer Paternal Grandmother        deceased 8  . Lymphoma Paternal Grandfather        also bladder; dx 66s; deceased 21  . Hyperlipidemia Neg Hx   . Diabetes Neg Hx   . Sudden death Neg Hx     Additionally, Kerry Mcpherson has a daughter (age 22) and 2 sons (ages 63 and 79). She has one full brother (age 64). She has a maternal half-sister (age 43) and half-brother (age 51). Her mother had 2 brothers. Her father (age 45) had 3 brothers and 4 sisters.  Kerry Mcpherson ancestry is Caucasian - NOS. There is no known Jewish  ancestry and no consanguinity.  Discussion: We reviewed the characteristics, features and inheritance patterns of hereditary cancer syndromes. We discussed her risk of harboring a mutation in the context of her personal and family history. We discussed the process of genetic testing, insurance coverage and implications of results: positive, negative and variant of uncertain significance (VUS).     Steele Berg, MS, [place] Certified Genetic Counselor phone: [phone] [email or web address]

## 2018-05-31 ENCOUNTER — Ambulatory Visit: Payer: Self-pay | Admitting: Genetic Counselor

## 2018-05-31 ENCOUNTER — Encounter: Payer: Self-pay | Admitting: Genetic Counselor

## 2018-05-31 DIAGNOSIS — Z1379 Encounter for other screening for genetic and chromosomal anomalies: Secondary | ICD-10-CM

## 2018-05-31 HISTORY — DX: Encounter for other screening for genetic and chromosomal anomalies: Z13.79

## 2018-05-31 NOTE — Progress Notes (Signed)
Cancer Genetics Clinic       Genetic Test Results    Patient Name: Kerry Mcpherson Patient DOB: [birth year] Patient Age: 43 y.o. Encounter Date: 05/31/2018  Referring Provider: Lanice Shirts, MD  Primary Care Provider: Lanice Shirts, MD   Kerry Mcpherson was called today to discuss genetic test results. Please see the Genetics note from her visit on 05/12/2018 for a detailed discussion of her personal and family history.  Genetic Testing: At the time of Kerry Mcpherson's visit, she decided to pursue genetic testing of multiple genes associated with hereditary susceptibility to cancer. Testing included sequencing and deletion/duplication analysis. Testing did not reveal a pathogenic mutation in any of the genes analyzed.  A copy of the genetic test report will be scanned into Epic under the Media tab.  Interpretation: These results are reassuring and indicate that Kerry Mcpherson does not likely have an increased risk of cancer due to a mutation in one of these genes. Her risk of breast cancer is elevated above general population risk due to her family history.  Since the current test is not perfect, it is possible that there may be a gene mutation that current testing cannot detect, but that chance is small. It is possible that a different genetic factor, which has not yet been discovered or is not on this panel, is responsible for the cancer diagnoses in the family. Again, the likelihood of this is low. Lastly, it may be that there is a detectable mutation in her family that she did not inherit. No additional testing is recommended at this time for Kerry Mcpherson.  Genes Analyzed: The genes analyzed were the 84 genes on [facility]'s Multi-Cancer panel (AIP, ALK, APC, ATM, AXIN2, BAP1, BARD1, BLM, BMPR1A, BRCA1, BRCA2, BRIP1, CASR, CDC73, CDH1, CDK4, CDKN1B, CDKN1C, CDKN2A, CEBPA, CHEK2, CTNNA1, DICER1, DIS3L2, EGFR, EPCAM, FH, FLCN, GATA2, GPC3, GREM1, HOXB13, HRAS, KIT, MAX, MEN1,  MET, MITF, MLH1, MSH2, MSH3, MSH6, MUTYH, NBN, NF1, NF2, NTHL1, PALB2, PDGFRA, PHOX2B, PMS2, POLD1, POLE, POT1, PRKAR1A, PTCH1, PTEN, RAD50, RAD51C, RAD51D, RB1, RECQL4, RET, RUNX1, SDHA, SDHAF2, SDHB, SDHC, SDHD, SMAD4, SMARCA4, SMARCB1, SMARCE1, STK11, SUFU, TERC, TERT, TMEM127, TP53, TSC1, TSC2, VHL, WRN, WT1).  Cancer Screening: Kerry Mcpherson is recommended to undergo cancer screenings for individuals in the general population.  The [employer]  recommends that women follow the breast screening recommendations below, but that these may need to be modified based on other risk factors such as dense breasts, biopsy history or family history.  Breast awareness - Women should be familiar with their breasts and promptly report changes to their healthcare provider.   Starting at age 90: Breast exam, risk assessment, and risk reduction counseling by the provider and mammogram every year. The provider may discuss screening with tomosynthesis.  Kerry Mcpherson is also recommended to undergo a yearly gynecologic exam and initiate colon cancer screening at age 51.  Family Members: Family members are at some increased risk of developing cancer, over the general population risk, simply due to the family history. They are recommended to speak with their own physicians about appropriate cancer screenings.   Any relative who had cancer at a young age or had a particularly rare cancer may also wish to pursue genetic testing. This is recommended for Kerry Mcpherson's mother who had breast cancer at age 51. Genetic counselors can be located in other cities, by visiting the website of the [employer] (ArtistMovie.se) and searching for a Dietitian by zip code.   Follow-Up: Cancer genetics is a rapidly advancing field and it is possible that new genetic tests will be appropriate for Kerry Mcpherson in the future. She is encouraged to remain in contact with Genetics on an annual basis  so that her personal and family histories can be updated, and she can learn of any advances in cancer genetics that may benefit the family. Kerry Mcpherson is welcome to call anytime with additional questions or schedule a follow-up clinic visit.    Steele Berg, MS, [place] Certified Genetic Counselor phone: [phone]

## 2018-10-03 ENCOUNTER — Other Ambulatory Visit: Payer: BLUE CROSS/BLUE SHIELD

## 2018-10-03 ENCOUNTER — Other Ambulatory Visit: Payer: Self-pay | Admitting: Internal Medicine

## 2018-10-03 ENCOUNTER — Encounter: Payer: Self-pay | Admitting: Physician Assistant

## 2018-10-03 ENCOUNTER — Other Ambulatory Visit: Payer: Self-pay

## 2018-10-03 ENCOUNTER — Ambulatory Visit
Admission: RE | Admit: 2018-10-03 | Discharge: 2018-10-03 | Disposition: A | Discharge status: Home / Self Care | Payer: BC Managed Care – PPO | Source: Ambulatory Visit | Attending: Internal Medicine | Admitting: Internal Medicine

## 2018-10-03 ENCOUNTER — Ambulatory Visit
Admission: RE | Admit: 2018-10-03 | Discharge: 2018-10-03 | Disposition: A | Discharge status: Home / Self Care | Payer: BLUE CROSS/BLUE SHIELD | Source: Ambulatory Visit | Attending: Internal Medicine | Admitting: Internal Medicine

## 2018-10-03 ENCOUNTER — Telehealth: Payer: BC Managed Care – PPO | Admitting: Physician Assistant

## 2018-10-03 DIAGNOSIS — B029 Zoster without complications: Secondary | ICD-10-CM

## 2018-10-03 DIAGNOSIS — N63 Unspecified lump in unspecified breast: Secondary | ICD-10-CM

## 2018-10-03 MED ORDER — PREDNISONE 10 MG PO TABS: 10.0000 mg | ORAL_TABLET | Freq: Every day | ORAL | 0 refills | Status: AC

## 2018-10-03 NOTE — Progress Notes (Signed)
E Visit for Rash  We are sorry that you are not feeling well. Here is how we plan to help!  You may have shingles. The diagnosis is supported by vesicular painful rash and history of chickenpox. If no response to treatment, please have a face to face evaluation for your symptoms.    I have prescribed  Prednisone 10 mg daily for 6 days (see taper instructions below)  Directions for 6 day taper: Day 1: 2 tablets before breakfast, 1 after both lunch & dinner and 2 at bedtime Day 2: 1 tab before breakfast, 1 after both lunch & dinner and 2 at bedtime Day 3: 1 tab at each meal & 1 at bedtime Day 4: 1 tab at breakfast, 1 at lunch, 1 at bedtime Day 5: 1 tab at breakfast & 1 tab at bedtime Day 6: 1 tab at breakfast    HOME CARE:   Take cool showers and avoid direct sunlight.  Apply cool compress or wet dressings.  Take a bath in an oatmeal bath.  Sprinkle content of one Aveeno packet under running faucet with comfortably warm water.  Bathe for 15-20 minutes, 1-2 times daily.  Pat dry with a towel. Do not rub the rash.  Use hydrocortisone cream.  Take an antihistamine like Benadryl for widespread rashes that itch.  The adult dose of Benadryl is 25-50 mg by mouth 4 times daily.  Caution:  This type of medication may cause sleepiness.  Do not drink alcohol, drive, or operate dangerous machinery while taking antihistamines.  Do not take these medications if you have prostate enlargement.  Read package instructions thoroughly on all medications that you take.  GET HELP RIGHT AWAY IF:   Symptoms don't go away after treatment.  Severe itching that persists.  If you rash spreads or swells.  If you rash begins to smell.  If it blisters and opens or develops a yellow-brown crust.  You develop a fever.  You have a sore throat.  You become short of breath.  MAKE SURE YOU:  Understand these instructions. Will watch your condition. Will get help right away if you are not doing well  or get worse.  Thank you for choosing an e-visit. Your e-visit answers were reviewed by a board certified advanced clinical practitioner to complete your personal care plan. Depending upon the condition, your plan could have included both over the counter or prescription medications. Please review your pharmacy choice. Be sure that the pharmacy you have chosen is open so that you can pick up your prescription now.  If there is a problem you may message your provider in [place] to have the prescription routed to another pharmacy. Your safety is important to [place]. If you have drug allergies check your prescription carefully.  For the next 24 hours, you can use MyChart to ask questions about today's visit, request a non-urgent call back, or ask for a work or school excuse from your e-visit provider. You will get an email in the next two days asking about your experience. I hope that your e-visit has been valuable and will speed your recovery.     I spent 5-10 minutes on review and completion of this note- Lacy Duverney [facility]

## 2019-04-05 ENCOUNTER — Ambulatory Visit
Admission: RE | Admit: 2019-04-05 | Discharge: 2019-04-05 | Disposition: A | Discharge status: Home / Self Care | Payer: BC Managed Care – PPO | Source: Ambulatory Visit | Attending: Internal Medicine | Admitting: Internal Medicine

## 2019-04-05 ENCOUNTER — Other Ambulatory Visit: Payer: Self-pay

## 2019-04-05 DIAGNOSIS — N63 Unspecified lump in unspecified breast: Secondary | ICD-10-CM

## 2023-09-07 ENCOUNTER — Other Ambulatory Visit: Payer: Self-pay | Admitting: Medical Genetics

## 2024-01-04 ENCOUNTER — Other Ambulatory Visit: Payer: Self-pay | Admitting: Medical Genetics

## 2024-01-04 DIAGNOSIS — Z006 Encounter for examination for normal comparison and control in clinical research program: Secondary | ICD-10-CM
